# Patient Record
Sex: Female | Born: 1995 | Race: Black or African American | Hispanic: No | Marital: Single | Smoking: Current some day smoker
Health system: Southern US, Community
[De-identification: ages and names within clinical notes are randomized; demographics above are authoritative.]

## PROBLEM LIST (undated history)

## (undated) DIAGNOSIS — Z789 Other specified health status: Secondary | ICD-10-CM

## (undated) HISTORY — PX: NO PAST SURGERIES: SHX2092

---

## 2018-07-09 NOTE — L&D Delivery Note (Signed)
Delivery Note:  Arrived at [facility] ER 1 minute after VMI delivered by ER physician.  Placenta by gentle traction shortly afterwards, intact 3 vessel cord. NICU team provided care to infant. Small vaginal wall laceration, no bleeding and required no repair. No other lacerations or tears noted. EBL 500 cc. Pt given pitocin 10 units IM and IV pitocin 40 units, plus 1000 mcg of Cytotec rectal. Uterus firm and appropriate lochia noted.  Pt transported to [facility] via [facility] for routine postpartum care. Infant transported to NICU via care link for further evaluation and care.

## 2018-07-28 ENCOUNTER — Other Ambulatory Visit: Payer: Self-pay

## 2018-07-28 ENCOUNTER — Emergency Department
Admission: EM | Admit: 2018-07-28 | Discharge: 2018-07-28 | Disposition: A | Discharge status: Home / Self Care | Payer: Self-pay | Attending: Emergency Medicine | Admitting: Emergency Medicine

## 2018-07-28 ENCOUNTER — Emergency Department: Payer: Self-pay

## 2018-07-28 DIAGNOSIS — R102 Pelvic and perineal pain: Secondary | ICD-10-CM

## 2018-07-28 DIAGNOSIS — R1030 Lower abdominal pain, unspecified: Secondary | ICD-10-CM

## 2018-07-28 DIAGNOSIS — O9989 Other specified diseases and conditions complicating pregnancy, childbirth and the puerperium: Secondary | ICD-10-CM | POA: Insufficient documentation

## 2018-07-28 DIAGNOSIS — Z3A18 18 weeks gestation of pregnancy: Secondary | ICD-10-CM

## 2018-07-28 LAB — URINALYSIS, COMPLETE (UACMP) WITH MICROSCOPIC
Bacteria, UA: NONE SEEN
Bilirubin Urine: NEGATIVE
Glucose, UA: NEGATIVE mg/dL
Hgb urine dipstick: NEGATIVE
Ketones, ur: NEGATIVE mg/dL
Nitrite: NEGATIVE
Protein, ur: NEGATIVE mg/dL
Specific Gravity, Urine: 1.017 (ref 1.005–1.030)
pH: 6 (ref 5.0–8.0)

## 2018-07-28 LAB — COMPREHENSIVE METABOLIC PANEL
ALT: 9 U/L (ref 0–44)
AST: 14 U/L — ABNORMAL LOW (ref 15–41)
Albumin: 3.6 g/dL (ref 3.5–5.0)
Alkaline Phosphatase: 55 U/L (ref 38–126)
Anion gap: 6 (ref 5–15)
BUN: <5 mg/dL — ABNORMAL LOW (ref 6–20)
CO2: 21 mmol/L — ABNORMAL LOW (ref 22–32)
Calcium: 8.9 mg/dL (ref 8.9–10.3)
Chloride: 108 mmol/L (ref 98–111)
Creatinine, Ser: 0.52 mg/dL (ref 0.44–1.00)
GFR calc Af Amer: >60 mL/min (ref >60)
GFR calc non Af Amer: >60 mL/min (ref >60)
Glucose, Bld: 93 mg/dL (ref 70–99)
POTASSIUM: 3.4 mmol/L — AB (ref 3.5–5.1)
Sodium: 135 mmol/L (ref 135–145)
Total Bilirubin: 0.7 mg/dL (ref 0.3–1.2)
Total Protein: 7.2 g/dL (ref 6.5–8.1)

## 2018-07-28 LAB — HCG, QUANTITATIVE, PREGNANCY: hCG, Beta Chain, Quant, S: 8663 m[IU]/mL — ABNORMAL HIGH (ref <5)

## 2018-07-28 LAB — CBC
HCT: 34.5 % — ABNORMAL LOW (ref 36.0–46.0)
Hemoglobin: 12.6 g/dL (ref 12.0–15.0)
MCH: 29.2 pg (ref 26.0–34.0)
MCHC: 36.5 g/dL — ABNORMAL HIGH (ref 30.0–36.0)
MCV: 79.9 fL — ABNORMAL LOW (ref 80.0–100.0)
Platelets: 299 10*3/uL (ref 150–400)
RBC: 4.32 MIL/uL (ref 3.87–5.11)
RDW: 12.8 % (ref 11.5–15.5)
WBC: 6.8 10*3/uL (ref 4.0–10.5)
nRBC: 0 % (ref 0.0–0.2)

## 2018-07-28 LAB — LIPASE, BLOOD: LIPASE: 21 U/L (ref 11–51)

## 2018-07-28 MED ORDER — SODIUM CHLORIDE 0.9% FLUSH: 3.0000 mL | Freq: Once | INTRAVENOUS | Status: DC

## 2018-07-28 NOTE — ED Provider Notes (Signed)
[facility] Emergency Department Provider Note   ____________________________________________    I have reviewed the triage vital signs and the nursing notes.   HISTORY  Chief Complaint Abdominal Pain     HPI Mary Underwood is a 23 y.o. female who presents with complaints of mild abdominal cramping over the last several days.  She reports she believes she is about [redacted] weeks pregnant.  She has not seen OB for this pregnancy.  This is her fourth pregnancy.  She reports she is Rh-, no vaginal bleeding.  Has not taken anything for her cramping.  No radiation of pain.  No nausea or vomiting.  No vaginal discharge  History reviewed. No pertinent past medical history.  There are no active problems to display for this patient.   History reviewed. No pertinent surgical history.  Prior to Admission medications   Not on File     Allergies Patient has no known allergies.  No family history on file.  Social History Social History   Tobacco Use  . Smoking status: Never Smoker  . Smokeless tobacco: Never Used  Substance Use Topics  . Alcohol use: Not Currently  . Drug use: Not Currently    Review of Systems  Constitutional: No fever/chills Eyes: No visual changes.  ENT: No sore throat. Cardiovascular: Denies chest pain. Respiratory: Denies shortness of breath. Gastrointestinal: As above Genitourinary: As above Musculoskeletal: Negative for back pain. Skin: Negative for rash. Neurological: Negative for headaches   ____________________________________________   PHYSICAL EXAM:  VITAL SIGNS: ED Triage Vitals  Enc Vitals Group     BP 07/28/18 1337 100/83     Pulse Rate 07/28/18 1337 95     Resp 07/28/18 1337 16     Temp 07/28/18 1337 97.8 F (36.6 C)     Temp Source 07/28/18 1337 Oral     SpO2 07/28/18 1337 97 %     Weight --      Height 07/28/18 1338 1.702 m (5\' 7" )     Head Circumference --      Peak Flow --      Pain Score  07/28/18 1338 6     Pain Loc --      Pain Edu? --      Excl. in GC? --     Constitutional: Alert and oriented. No acute distress. Pleasant and interactive  Nose: No congestion/rhinnorhea. Mouth/Throat: Mucous membranes are moist.    Cardiovascular: Normal rate, regular rhythm. Grossly normal heart sounds.  Good peripheral circulation. Respiratory: Normal respiratory effort.  No retractions. Lungs CTAB. Gastrointestinal: Soft and nontender. No distention.  Gravid Genitourinary: deferred for specialist Musculoskeletal:   Warm and well perfused Neurologic:  Normal speech and language. No gross focal neurologic deficits are appreciated.  Skin:  Skin is warm, dry and intact. No rash noted. Psychiatric: Mood and affect are normal. Speech and behavior are normal.  ____________________________________________   LABS (all labs ordered are listed, but only abnormal results are displayed)  Labs Reviewed  COMPREHENSIVE METABOLIC PANEL - Abnormal; Notable for the following components:      Result Value   Potassium 3.4 (*)    CO2 21 (*)    BUN <5 (*)    AST 14 (*)    All other components within normal limits  CBC - Abnormal; Notable for the following components:   HCT 34.5 (*)    MCV 79.9 (*)    MCHC 36.5 (*)    All other components within normal limits  URINALYSIS, COMPLETE (UACMP) WITH MICROSCOPIC - Abnormal; Notable for the following components:   Color, Urine YELLOW (*)    APPearance CLEAR (*)    Leukocytes, UA TRACE (*)    All other components within normal limits  HCG, QUANTITATIVE, PREGNANCY - Abnormal; Notable for the following components:   hCG, Beta Chain, Quant, S 1,6108,663 (*)    All other components within normal limits  LIPASE, BLOOD   ____________________________________________  EKG   ____________________________________________  RADIOLOGY  Ultrasound demonstrates 18-week pregnancy ____________________________________________   PROCEDURES  Procedure(s)  performed: No  Procedures   Critical Care performed: No ____________________________________________   INITIAL IMPRESSION / ASSESSMENT AND PLAN / ED COURSE  Pertinent labs & imaging results that were available during my care of the patient were reviewed by me and considered in my medical decision making (see chart for details).  Patient with intermittent mild abdominal cramping particularly in the lower abdomen.  Ultrasound demonstrates 18-week pregnancy, she has not seen OB for this.  Pain relieved with Tylenol significantly.  Lab work is overall unremarkable.  She will require close follow-up with OB.    ____________________________________________   FINAL CLINICAL IMPRESSION(S) / ED DIAGNOSES  Final diagnoses:  [redacted] weeks gestation of pregnancy  Lower abdominal pain        Note:  This document was prepared using Dragon voice recognition software and may include unintentional dictation errors.   Jene EveryKinner, Sondi Desch, MD 07/28/18 [phone]

## 2018-07-28 NOTE — ED Triage Notes (Signed)
Pt c/o lower abd pain that radiates into the back for the past 2 days. Denies urinary sx or bleeding. Pt states she is Rh-.

## 2018-07-28 NOTE — ED Triage Notes (Signed)
First Nurse Note:  Arrives, [redacted] weeks pregnant, c/o lower abdominal pain x 2 days.  AAOx3.  Skin warm and dry. NAD

## 2018-11-17 ENCOUNTER — Other Ambulatory Visit: Payer: Self-pay

## 2018-11-17 ENCOUNTER — Inpatient Hospital Stay (HOSPITAL_COMMUNITY)
Admission: EM | Admit: 2018-11-17 | Discharge: 2018-11-18 | DRG: 807 | Disposition: A | Discharge status: Home / Self Care | Payer: Medicaid Other | Attending: Obstetrics and Gynecology | Admitting: Obstetrics and Gynecology

## 2018-11-17 DIAGNOSIS — Z3A32 32 weeks gestation of pregnancy: Secondary | ICD-10-CM

## 2018-11-17 DIAGNOSIS — O479 False labor, unspecified: Secondary | ICD-10-CM | POA: Diagnosis present

## 2018-11-17 DIAGNOSIS — Z6791 Unspecified blood type, Rh negative: Secondary | ICD-10-CM

## 2018-11-17 DIAGNOSIS — O26893 Other specified pregnancy related conditions, third trimester: Secondary | ICD-10-CM | POA: Diagnosis not present

## 2018-11-17 DIAGNOSIS — O09899 Supervision of other high risk pregnancies, unspecified trimester: Secondary | ICD-10-CM

## 2018-11-17 DIAGNOSIS — O4700 False labor before 37 completed weeks of gestation, unspecified trimester: Secondary | ICD-10-CM | POA: Diagnosis present

## 2018-11-17 DIAGNOSIS — Z3A34 34 weeks gestation of pregnancy: Secondary | ICD-10-CM

## 2018-11-17 DIAGNOSIS — O26899 Other specified pregnancy related conditions, unspecified trimester: Secondary | ICD-10-CM

## 2018-11-17 DIAGNOSIS — O0930 Supervision of pregnancy with insufficient antenatal care, unspecified trimester: Secondary | ICD-10-CM

## 2018-11-17 DIAGNOSIS — D582 Other hemoglobinopathies: Secondary | ICD-10-CM | POA: Diagnosis present

## 2018-11-17 DIAGNOSIS — Z1159 Encounter for screening for other viral diseases: Secondary | ICD-10-CM

## 2018-11-17 HISTORY — DX: Other specified health status: Z78.9

## 2018-11-17 LAB — COMPREHENSIVE METABOLIC PANEL
ALT: 10 U/L (ref 0–44)
AST: 13 U/L — ABNORMAL LOW (ref 15–41)
Albumin: 3 g/dL — ABNORMAL LOW (ref 3.5–5.0)
Alkaline Phosphatase: 177 U/L — ABNORMAL HIGH (ref 38–126)
Anion gap: 8 (ref 5–15)
BUN: 5 mg/dL — ABNORMAL LOW (ref 6–20)
CO2: 20 mmol/L — ABNORMAL LOW (ref 22–32)
Calcium: 8.7 mg/dL — ABNORMAL LOW (ref 8.9–10.3)
Chloride: 107 mmol/L (ref 98–111)
Creatinine, Ser: 0.52 mg/dL (ref 0.44–1.00)
GFR calc Af Amer: >60 mL/min (ref >60)
GFR calc non Af Amer: >60 mL/min (ref >60)
Glucose, Bld: 106 mg/dL — ABNORMAL HIGH (ref 70–99)
Potassium: 3.6 mmol/L (ref 3.5–5.1)
Sodium: 135 mmol/L (ref 135–145)
Total Bilirubin: 0.4 mg/dL (ref 0.3–1.2)
Total Protein: 6.7 g/dL (ref 6.5–8.1)

## 2018-11-17 LAB — PROTIME-INR
INR: 0.9 (ref 0.8–1.2)
Prothrombin Time: 12.3 seconds (ref 11.4–15.2)

## 2018-11-17 LAB — CBC
HCT: 31 % — ABNORMAL LOW (ref 36.0–46.0)
HCT: 28.5 % — ABNORMAL LOW (ref 36.0–46.0)
Hemoglobin: 10.9 g/dL — ABNORMAL LOW (ref 12.0–15.0)
Hemoglobin: 10.2 g/dL — ABNORMAL LOW (ref 12.0–15.0)
MCH: 29.6 pg (ref 26.0–34.0)
MCH: 29.3 pg (ref 26.0–34.0)
MCHC: 35.2 g/dL (ref 30.0–36.0)
MCHC: 35.8 g/dL (ref 30.0–36.0)
MCV: 84.2 fL (ref 80.0–100.0)
MCV: 81.9 fL (ref 80.0–100.0)
Platelets: 273 10*3/uL (ref 150–400)
Platelets: 269 10*3/uL (ref 150–400)
RBC: 3.68 MIL/uL — ABNORMAL LOW (ref 3.87–5.11)
RBC: 3.48 MIL/uL — ABNORMAL LOW (ref 3.87–5.11)
RDW: 13.3 % (ref 11.5–15.5)
RDW: 13.2 % (ref 11.5–15.5)
WBC: 8.7 10*3/uL (ref 4.0–10.5)
WBC: 16.1 10*3/uL — ABNORMAL HIGH (ref 4.0–10.5)
nRBC: 0 % (ref 0.0–0.2)
nRBC: 0 % (ref 0.0–0.2)

## 2018-11-17 LAB — TYPE AND SCREEN
ABO/RH(D): O NEG
Antibody Screen: NEGATIVE

## 2018-11-17 LAB — RAPID URINE DRUG SCREEN, HOSP PERFORMED
Amphetamines: NOT DETECTED
Barbiturates: NOT DETECTED
Benzodiazepines: NOT DETECTED
Cocaine: NOT DETECTED
Opiates: NOT DETECTED
Tetrahydrocannabinol: NOT DETECTED

## 2018-11-17 LAB — RPR: RPR Ser Ql: NONREACTIVE

## 2018-11-17 LAB — HIV ANTIBODY (ROUTINE TESTING W REFLEX): HIV Screen 4th Generation wRfx: NONREACTIVE

## 2018-11-17 LAB — APTT: aPTT: 27 seconds (ref 24–36)

## 2018-11-17 LAB — ABO/RH: ABO/RH(D): O NEG

## 2018-11-17 LAB — SARS CORONAVIRUS 2 BY RT PCR (HOSPITAL ORDER, PERFORMED IN ~~LOC~~ HOSPITAL LAB): SARS Coronavirus 2: NEGATIVE

## 2018-11-17 LAB — HEPATITIS B SURFACE ANTIGEN: Hepatitis B Surface Ag: NEGATIVE

## 2018-11-17 MED ORDER — WITCH HAZEL-GLYCERIN EX PADS: 1.0000 "application " | MEDICATED_PAD | CUTANEOUS | Status: DC | PRN

## 2018-11-17 MED ORDER — BETAMETHASONE SOD PHOS & ACET 6 (3-3) MG/ML IJ SUSP
12.0000 mg | Freq: Once | INTRAMUSCULAR | Status: AC
  Administered 2018-11-17: 12 mg via INTRAMUSCULAR
  Filled 2018-11-17: qty 5

## 2018-11-17 MED ORDER — OXYCODONE-ACETAMINOPHEN 5-325 MG PO TABS: 1.0000 | ORAL_TABLET | ORAL | Status: DC | PRN

## 2018-11-17 MED ORDER — SENNOSIDES-DOCUSATE SODIUM 8.6-50 MG PO TABS
2.0000 | ORAL_TABLET | ORAL | Status: DC
  Administered 2018-11-18: 2 via ORAL
  Filled 2018-11-17: qty 2

## 2018-11-17 MED ORDER — OXYTOCIN 40 UNITS IN NORMAL SALINE INFUSION - SIMPLE MED
2.5000 [IU]/h | INTRAVENOUS | Status: DC | PRN
  Administered 2018-11-17 (×2): 2.5 [IU]/h via INTRAVENOUS

## 2018-11-17 MED ORDER — SIMETHICONE 80 MG PO CHEW: 80.0000 mg | CHEWABLE_TABLET | ORAL | Status: DC | PRN

## 2018-11-17 MED ORDER — OXYTOCIN 10 UNIT/ML IJ SOLN
INTRAMUSCULAR | Status: AC
  Administered 2018-11-17: 05:00:00 10 [IU]
  Filled 2018-11-17: qty 1

## 2018-11-17 MED ORDER — TETANUS-DIPHTH-ACELL PERTUSSIS 5-2.5-18.5 LF-MCG/0.5 IM SUSP
0.5000 mL | Freq: Once | INTRAMUSCULAR | Status: AC
  Administered 2018-11-18: 11:00:00 0.5 mL via INTRAMUSCULAR
  Filled 2018-11-17: qty 0.5

## 2018-11-17 MED ORDER — BENZOCAINE-MENTHOL 20-0.5 % EX AERO: 1.0000 "application " | INHALATION_SPRAY | CUTANEOUS | Status: DC | PRN

## 2018-11-17 MED ORDER — DIBUCAINE (PERIANAL) 1 % EX OINT: 1.0000 "application " | TOPICAL_OINTMENT | CUTANEOUS | Status: DC | PRN

## 2018-11-17 MED ORDER — DIPHENHYDRAMINE HCL 25 MG PO CAPS: 25.0000 mg | ORAL_CAPSULE | Freq: Four times a day (QID) | ORAL | Status: DC | PRN

## 2018-11-17 MED ORDER — ACETAMINOPHEN 325 MG PO TABS: 650.0000 mg | ORAL_TABLET | ORAL | Status: DC | PRN

## 2018-11-17 MED ORDER — LIDOCAINE HCL 1 % IJ SOLN
INTRAMUSCULAR | Status: AC
  Filled 2018-11-17: qty 20

## 2018-11-17 MED ORDER — ONDANSETRON HCL 4 MG/2ML IJ SOLN: 4.0000 mg | INTRAMUSCULAR | Status: DC | PRN

## 2018-11-17 MED ORDER — ZOLPIDEM TARTRATE 5 MG PO TABS: 5.0000 mg | ORAL_TABLET | Freq: Every evening | ORAL | Status: DC | PRN

## 2018-11-17 MED ORDER — LACTATED RINGERS IV SOLN: INTRAVENOUS | Status: DC

## 2018-11-17 MED ORDER — MISOPROSTOL 200 MCG PO TABS
1000.0000 ug | ORAL_TABLET | Freq: Once | ORAL | Status: AC
  Administered 2018-11-17: 1000 ug via RECTAL

## 2018-11-17 MED ORDER — LACTATED RINGERS IV BOLUS
1000.0000 mL | Freq: Once | INTRAVENOUS | Status: AC
  Administered 2018-11-17: 1000 mL via INTRAVENOUS

## 2018-11-17 MED ORDER — ONDANSETRON HCL 4 MG PO TABS: 4.0000 mg | ORAL_TABLET | ORAL | Status: DC | PRN

## 2018-11-17 MED ORDER — OXYCODONE-ACETAMINOPHEN 5-325 MG PO TABS: 2.0000 | ORAL_TABLET | ORAL | Status: DC | PRN

## 2018-11-17 MED ORDER — RHO D IMMUNE GLOBULIN 1500 UNIT/2ML IJ SOSY
300.0000 ug | PREFILLED_SYRINGE | Freq: Once | INTRAMUSCULAR | Status: DC
  Filled 2018-11-17: qty 2

## 2018-11-17 MED ORDER — COCONUT OIL OIL: 1.0000 "application " | TOPICAL_OIL | Status: DC | PRN

## 2018-11-17 MED ORDER — IBUPROFEN 600 MG PO TABS
600.0000 mg | ORAL_TABLET | Freq: Four times a day (QID) | ORAL | Status: DC
  Administered 2018-11-17 – 2018-11-18 (×4): 600 mg via ORAL
  Filled 2018-11-17 (×5): qty 1

## 2018-11-17 MED ORDER — PRENATAL MULTIVITAMIN CH
1.0000 | ORAL_TABLET | Freq: Every day | ORAL | Status: DC
  Administered 2018-11-17: 12:00:00 1 via ORAL
  Filled 2018-11-17 (×2): qty 1

## 2018-11-17 NOTE — Progress Notes (Signed)
Pt. Arrived to [facility] c/o ctx since 1am. [facility] RN notified [facility]OB of patients arrival @0343 . [facility] RN arrived to [facility] ED @ [phone]. Pt. Noted to be contracting every 2-4 minutes and 7cm dilated. Pt. Given 12mg  betamethasone IM per MD order. Attending MD notified and transport en route. Pt. Progressed rapidly before transport and SVD @0435  by Dr. Rhunette Croft (ED Physician). Attending MD Dr. Alysia Penna arrived @0436  and delivered placenta @0441 . [facility] provided blow by oxygen to baby until NICU arrived.   Pt. Had moderate bleeding with some clots. 10 oxytocin given IM followed by 1000mg  cytotec and 40 oxytocin IV. Bleeding controlled. No vaginal repair. Pt. Prepped for transport via [facility] to [facility] specialty care unit. Report given to Marquis Buggy RN. Pt. Vitals stable upon transport.   FHR tracing on paper. Sent to be copied into patient chart.

## 2018-11-17 NOTE — ED Notes (Signed)
Rapid OB RN called 

## 2018-11-17 NOTE — ED Provider Notes (Addendum)
[facility]-EMERGENCY DEPT Provider Note   CSN: [id number] Arrival date & time: 11/17/18  0341    History   Chief Complaint Chief Complaint  Patient presents with  . Contractions    HPI Micki RileyChristina Kloeppel is a 23 y.o. female.     HPI 23 year old 714, P3 woman who is about [redacted] weeks pregnant comes into the ER with chief complaint of abdominal pain.  Patient does not have an active OB doctor and is new to [place].  She reports that all day she has been having some abdominal and back discomfort.  However around 1:00 in the morning she started having regular contraction type pain, that have gotten intense and more frequent.  She describes the pain is similar to her labor pain.  All of patient's prior pregnancy have led to vaginal delivery and there have been no complications.  She denies any complications with this pregnancy.  Patient has no medical problems.  She denies any trauma, UTI-like symptoms, vaginal discharge, vaginal bleeding and has not noted a gush of fluid down her legs.  Past Medical History:  Diagnosis Date  . Medical history non-contributory     Patient Active Problem List   Diagnosis Date Noted  . Postpartum care following vaginal delivery 11/17/2018    Past Surgical History:  Procedure Laterality Date  . NO PAST SURGERIES       OB History    Gravida  5   Para  3   Term  3   Preterm      AB  1   Living  3     SAB      TAB  1   Ectopic      Multiple      Live Births  3            Home Medications    Prior to Admission medications   Not on File    Family History History reviewed. No pertinent family history.  Social History Social History   Tobacco Use  . Smoking status: Never Smoker  . Smokeless tobacco: Never Used  Substance Use Topics  . Alcohol use: Not Currently  . Drug use: Not Currently     Allergies   Patient has no known allergies.   Review of Systems Review of Systems  Unable to  perform ROS: Acuity of condition  Constitutional: Positive for activity change.  Gastrointestinal: Positive for abdominal pain. Negative for nausea.  Genitourinary: Negative for dysuria.  Allergic/Immunologic: Negative for immunocompromised state.  Hematological: Does not bruise/bleed easily.     Physical Exam Updated Vital Signs BP 121/74 (BP Location: Left Arm)   Pulse 85   Temp 98.1 F (36.7 C) (Oral)   Resp 18   Ht 5\' 6"  (1.676 m)   Wt 72.6 kg   SpO2 99%   BMI 25.82 kg/m   Physical Exam Vitals signs and nursing note reviewed.  Constitutional:      Appearance: She is well-developed.  HENT:     Head: Normocephalic and atraumatic.  Neck:     Musculoskeletal: Normal range of motion and neck supple.  Cardiovascular:     Rate and Rhythm: Normal rate.  Pulmonary:     Effort: Pulmonary effort is normal.  Abdominal:     General: Bowel sounds are normal.  Genitourinary:    Comments: Perform a rapid response nurse. Skin:    General: Skin is warm and dry.  Neurological:     Mental Status: She is alert  and oriented to person, place, and time.      ED Treatments / Results  Labs (all labs ordered are listed, but only abnormal results are displayed) Labs Reviewed  CBC - Abnormal; Notable for the following components:      Result Value   RBC 3.68 (*)    Hemoglobin 10.9 (*)    HCT 31.0 (*)    All other components within normal limits  COMPREHENSIVE METABOLIC PANEL - Abnormal; Notable for the following components:   CO2 20 (*)    Glucose, Bld 106 (*)    BUN 5 (*)    Calcium 8.7 (*)    Albumin 3.0 (*)    AST 13 (*)    Alkaline Phosphatase 177 (*)    All other components within normal limits  SARS CORONAVIRUS 2 (HOSPITAL ORDER, PERFORMED IN [facility])  PROTIME-INR  APTT  CBC  HEPATITIS B SURFACE ANTIGEN  RUBELLA SCREEN  RPR  HIV ANTIBODY (ROUTINE TESTING W REFLEX)    EKG None  Radiology No results found.  Procedures  Normal spontaneous  vaginal delivery  Date of procedure: 11-17-18 Preop diagnosis: 34-week IUP in active labor Postop diagnosis: Delivery of a 34-week gestation female around 430 a.m. Procedure: Normal spontaneous vaginal delivery Anesthesia: None Physician: Derwood Kaplan, MD emergency medicine EBL: 300 ml  Indication: 23 year old G7, P3 woman presents to the ER at about 34 weeks IUP with regular uterine contractions.  Her prenatal course is uncomplicated, except for the fact that she has not had appropriate follow-up and there has been labs and prenatal care.  Patient is unsure if she had any routine screening done.  Her prior deliveries were all vaginal and were without any complications.  Patient presented to the ER around 3:45 AM.  Subsequent exam revealed that patient was 7-1/2 cm dilated without rupture of membrane.  Within few minutes patient advanced to 9 cm dilation.  Procedure note: The patient was noted to be advancing quickly.  She was having regular and frequent contractions which were severe.  Patient was placed in dorsal lithotomy position and prepped for the vaginal delivery.  No anesthesia was provided and the patient tolerated the procedure without significant difficulty.  The patient was asked to push in synchrony with the contraction, and the entire baby was delivered spontaneously during 1 push.  The infant had a spontaneous cry and was passed on to the warmer there the oropharynx and the nasopharynx were then suctioned.  Nuchal cord was checked, clamped and cut.  2 arteries and 1 vein were noted.  No laceration was appreciated.  Fundal massage initiated.  Placenta was delivered subsequently without any difficulty.  Fetal heart tones were reassuring. Appropriate labs were ordered.  Patient remained normotensive throughout the procedure.  After the delivery of the infant, the placenta was also delivered without significant problems.  Fundal massage was continued until OB team arrived.  We were  able to remove some of the blood clots from the vaginal vault, and OB team took over given persistent bleeding.  .Critical Care Performed by: Derwood Kaplan, MD Authorized by: Derwood Kaplan, MD   Critical care provider statement:    Critical care time (minutes):  31   Critical care was necessary to treat or prevent imminent or life-threatening deterioration of the following conditions: LABOR.   Critical care was time spent personally by me on the following activities:  Discussions with consultants, evaluation of patient's response to treatment, examination of patient, ordering and performing treatments and  interventions, ordering and review of laboratory studies, ordering and review of radiographic studies, pulse oximetry, re-evaluation of patient's condition, obtaining history from patient or surrogate and review of old charts   I assumed direction of critical care for this patient from another provider in my specialty: yes     (including critical care time)  Medications Ordered in ED Medications  rho (d) immune globulin (RHIG/RHOPHYLAC) injection 300 mcg (has no administration in time range)  ibuprofen (ADVIL) tablet 600 mg (has no administration in time range)  acetaminophen (TYLENOL) tablet 650 mg (has no administration in time range)  zolpidem (AMBIEN) tablet 5 mg (has no administration in time range)  diphenhydrAMINE (BENADRYL) capsule 25 mg (has no administration in time range)  senna-docusate (Senokot-S) tablet 2 tablet (has no administration in time range)  simethicone (MYLICON) chewable tablet 80 mg (has no administration in time range)  ondansetron (ZOFRAN) tablet 4 mg (has no administration in time range)    Or  ondansetron (ZOFRAN) injection 4 mg (has no administration in time range)  prenatal multivitamin tablet 1 tablet (has no administration in time range)  witch hazel-glycerin (TUCKS) pad 1 application (has no administration in time range)    And  dibucaine  (NUPERCAINAL) 1 % rectal ointment 1 application (has no administration in time range)  benzocaine-Menthol (DERMOPLAST) 20-0.5 % topical spray 1 application (has no administration in time range)  coconut oil (has no administration in time range)  Tdap (BOOSTRIX) injection 0.5 mL (has no administration in time range)  oxytocin (PITOCIN) IV infusion 40 units in NS 1000 mL - Premix (2.5 Units/hr Intravenous New Bag/Given 11/17/18 0500)  lactated ringers infusion (has no administration in time range)  oxyCODONE-acetaminophen (PERCOCET/ROXICET) 5-325 MG per tablet 1 tablet (has no administration in time range)  oxyCODONE-acetaminophen (PERCOCET/ROXICET) 5-325 MG per tablet 2 tablet (has no administration in time range)  lactated ringers bolus 1,000 mL (1,000 mLs Intravenous New Bag/Given 11/17/18 0416)  betamethasone acetate-betamethasone sodium phosphate (CELESTONE) injection 12 mg (12 mg Intramuscular Given 11/17/18 0417)  oxytocin (PITOCIN) 10 UNIT/ML injection (10 Units  Given 11/17/18 0447)  misoprostol (CYTOTEC) tablet 1,000 mcg (1,000 mcg Rectal Given 11/17/18 0500)     Initial Impression / Assessment and Plan / ED Course  I have reviewed the triage vital signs and the nursing notes.  Pertinent labs & imaging results that were available during my care of the patient were reviewed by me and considered in my medical decision making (see chart for details).        23 year old G45, P3 woman comes to the ER with chief complaint of abdominal and pelvic pain. It appears that she is in labor based on history. Patient was placed on toco monitor, and it confirms that she is having contractions every 3 to 4 minutes, and that patient is in early labor. OB history is unremarkable besides 3 into NSVD, last delivery being 1 year ago.  She has had labs to follow-up with this pregnancy, but it does not appear that she has had any complications.  Medically she is healthy.  7:02 AM Rapid response OB nurse had  completed the pelvic exam.  Patient is noted to be at 7-1/2 centimeter dilation. Case discussed with the [facility] physician by myself and the rapid response nurse.  We have already contacted [facility] to arrive for rapid transfer to [facility].  Basic labs, fluid bolus given.  Betamethasone given at the request of OB team.  Final Clinical Impressions(s) / ED Diagnoses   Final  diagnoses:  Preterm labor in third trimester with preterm delivery, single or unspecified fetus    ED Discharge Orders    None       Derwood Kaplan, MD 11/17/18 Jeannie Fend, MD 11/17/18 [phone]

## 2018-11-17 NOTE — ED Triage Notes (Addendum)
Pt arrived having contractions roughly 10 minutes apart around 1am. Pt stating she woke up from her sleep with increased contractions, currently having contractions roughly 4 minutes apart. Pt [redacted] weeks pregnant. Pt denies any fluids leaking.

## 2018-11-17 NOTE — ED Notes (Signed)
[facility] contacted for rapid transport. Spoke with Britta Mccreedy

## 2018-11-17 NOTE — H&P (Signed)
Mary Underwood is a 23 y.o. female [id number] s/p SVD at 34 weeks at [facility] ER. Pt received limited PNC. Last OB visit was in April.  Presented to [facility] ER with c/o painful ut ctx since 1 AM. Evaluated by rapid response OB nurse, noted to be contracting q 2-4 minutes, 7 cm dilated. Plan was to transfer to [facility] for further management and delivery.  However pt progressed rapidly and was unable to be transferred. I called back up attending, Dr. Marcie Bal into [facility] and I proceeded to [facility] ER.  Pt delivered VMI at 4:35 AM. I arrived 1 minute after delivery. See delivery note for additional information.  NICU arrived and provided care to infant OB History    Gravida  1   Para      Term      Preterm      AB      Living        SAB      TAB      Ectopic      Multiple      Live Births             No past medical history on file. No past surgical history on file. Family History: family history is not on file. Social History:  reports that she has never smoked. She has never used smokeless tobacco. She reports previous alcohol use. She reports previous drug use.      Review of Systems  Constitutional: Negative.   Respiratory: Negative.   Cardiovascular: Negative.   Gastrointestinal: Positive for abdominal pain.  Genitourinary: Negative.    History Dilation: 10 Effacement (%): 100 Station: Plus 2 Exam by:: C Lendell Caprice RN Blood pressure 111/73, pulse 86, temperature (!) 97.5 F (36.4 C), temperature source Oral, resp. rate (!) 22, height 5\' 6"  (1.676 m), weight 72.6 kg, SpO2 100 %. Exam Physical Exam  Constitutional: She appears well-developed and well-nourished.  Cardiovascular: Normal rate and regular rhythm.  Respiratory: Effort normal and breath sounds normal.  GI: Soft. Bowel sounds are normal.  Uterus firm, U-2  Genitourinary:    Genitourinary Comments: Nl EGBUS Small vaginal laceration, not bleeding Nl lochia     Prenatal labs: ABO, Rh:   Antibody:   Rubella:    RPR:    HBsAg:    HIV:    GBS:     Assessment/Plan: DOD SVD at 34 weeks Limited PNC.  Admit to OB SCU for routine postpartum care.   Hermina Staggers 11/17/2018, 6:07 AM

## 2018-11-17 NOTE — Progress Notes (Addendum)
Daily Postpartum Note  Admission Date: 11/17/2018 Current Date: 11/17/2018 9:45 AM  Mary Underwood is a 23 y.o. [id number] PPD#0 SVD/intact perineum @ 32wks (18wk u/s at [facility] ED). Patient admitted after PTB at the [facility] ED.  Pregnancy complicated by: Patient Active Problem List   Diagnosis Date Noted  . Postpartum care following vaginal delivery 11/17/2018  . No prenatal care in current pregnancy 11/17/2018  . Preterm delivery, delivered 11/17/2018  . Short interval between pregnancies affecting pregnancy, antepartum 11/17/2018  . 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 Pt about to walk to NICU to see baby  Objective:    Current Vital Signs 24h Vital Sign Ranges  T 98.7 F (37.1 C) Temp  Avg: 98.1 F (36.7 C)  Min: 97.5 F (36.4 C)  Max: 98.7 F (37.1 C)  BP (!) 108/55 BP  Min: 102/70  Max: 121/74  HR 78 Pulse  Avg: 88.1  Min: 78  Max: 98  RR 17 Resp  Avg: 17  Min: 15  Max: 22  SaO2 97 % (room air) SpO2  Avg: 99.3 %  Min: 97 %  Max: 100 %       24 Hour I/O Current Shift I/O  Time Ins Outs 05/10 0701 - 05/11 0700 In: -  Out: 600  No intake/output data recorded.    Physical exam: General: Well nourished, well developed female in no acute distress. Abdomen: nttp, firm fundus below umbilicus Skin: Warm and dry.   Medications: Current Facility-Administered Medications  Medication Dose Route Frequency Provider Last Rate Last Dose  . acetaminophen (TYLENOL) tablet 650 mg  650 mg Oral Q4H PRN Hermina Staggers, MD      . benzocaine-Menthol (DERMOPLAST) 20-0.5 % topical spray 1 application  1 application Topical PRN Hermina Staggers, MD      . coconut oil  1 application Topical PRN Hermina Staggers, MD      . witch hazel-glycerin (TUCKS) pad 1 application  1 application Topical PRN Hermina Staggers, MD       And  . dibucaine (NUPERCAINAL) 1 % rectal ointment 1 application  1 application Rectal PRN Hermina Staggers,  MD      . diphenhydrAMINE (BENADRYL) capsule 25 mg  25 mg Oral Q6H PRN Hermina Staggers, MD      . ibuprofen (ADVIL) tablet 600 mg  600 mg Oral Q6H Hermina Staggers, MD      . lactated ringers infusion   Intravenous Continuous Hermina Staggers, MD      . ondansetron [facility]) tablet 4 mg  4 mg Oral Q4H PRN Hermina Staggers, MD       Or  . ondansetron (ZOFRAN) injection 4 mg  4 mg Intravenous Q4H PRN Hermina Staggers, MD      . oxyCODONE-acetaminophen (PERCOCET/ROXICET) 5-325 MG per tablet 1 tablet  1 tablet Oral Q4H PRN Hermina Staggers, MD      . oxyCODONE-acetaminophen (PERCOCET/ROXICET) 5-325 MG per tablet 2 tablet  2 tablet Oral Q4H PRN Hermina Staggers, MD      . oxytocin (PITOCIN) IV infusion 40 units in NS 1000 mL - Premix  2.5 Units/hr Intravenous Continuous PRN Hermina Staggers, MD 62.5 mL/hr at 11/17/18 0500 2.5 Units/hr at 11/17/18 0500  . prenatal multivitamin tablet 1 tablet  1 tablet Oral Q1200 Hermina Staggers, MD      . rho (d) immune globulin (RHIG/RHOPHYLAC)  injection 300 mcg  300 mcg Intravenous Once Hermina StaggersErvin, Michael L, MD      . Melene Muller[START ON 11/18/2018] senna-docusate (Senokot-S) tablet 2 tablet  2 tablet Oral Q24H Hermina StaggersErvin, Michael L, MD      . simethicone [facility](MYLICON) chewable tablet 80 mg  80 mg Oral PRN Hermina StaggersErvin, Michael L, MD      . Melene Muller[START ON 11/18/2018] Tdap (BOOSTRIX) injection 0.5 mL  0.5 mL Intramuscular Once Hermina StaggersErvin, Michael L, MD      . zolpidem (AMBIEN) tablet 5 mg  5 mg Oral QHS PRN Hermina StaggersErvin, Michael L, MD        Labs:  Recent Labs  Lab 11/17/18 0409  WBC 8.7  HGB 10.9*  HCT 31.0*  PLT 273    Recent Labs  Lab 11/17/18 0409  NA 135  K 3.6  CL 107  CO2 20*  BUN 5*  CREATININE 0.52  CALCIUM 8.7*  PROT 6.7  BILITOT 0.4  ALKPHOS 177*  ALT 10  AST 13*  GLUCOSE 106*    Pending: hepB surf Ag, rpr, hiv, type and screen Negative: cmp, covid 19, coags  Radiology: none  Assessment & Plan:  Pt doing well *PP: routine care. [facility] F/u OB labs *no  PNC: SW consulted. UDS ordered. Pt states [facility] and never saw a clinic or provider for this pregnancy. Last pregnancy in 2019 records reviewed in care everywhere. *Rh neg: f/u t&s. Newborn is rh neg  *PPx: OOB ad lib *FEN/GI: regular diet *Dispo: likely PPD#2.   Cornelia Copaharlie Jameer Storie, Jr. MD Attending Center for [facility](Faculty Practice)

## 2018-11-17 NOTE — ED Notes (Signed)
Pharmacy contacted for med

## 2018-11-17 NOTE — Plan of Care (Signed)
  Problem: Education: Goal: Knowledge of General Education information will improve Description Including pain rating scale, medication(s)/side effects and non-pharmacologic comfort measures Outcome: Completed/Met

## 2018-11-17 NOTE — Lactation Note (Signed)
This note was copied from a baby's chart. Lactation Consultation Note  Patient Name: Mary Underwood KPTWS'F Date: 11/17/2018 Reason for consult: Initial assessment;Preterm <34wks Baby is 5 hours old/32.6 weeks in NICU.  Mom is currently sleeping but initiated pumping recently and obtained about 10 mls.  RN assisted mom.  Will follow up later.  Breastfeeding consultation services and NICU booklet left at bedside.  Maternal Data    Feeding    LATCH Score                   Interventions    Lactation Tools Discussed/Used Initiated by:: RN Date initiated:: 11/17/18   Consult Status Consult Status: Follow-up Date: 11/18/18 Follow-up type: In-patient    [name] 11/17/2018, 9:51 AM

## 2018-11-18 DIAGNOSIS — O4700 False labor before 37 completed weeks of gestation, unspecified trimester: Secondary | ICD-10-CM | POA: Diagnosis present

## 2018-11-18 DIAGNOSIS — O479 False labor, unspecified: Secondary | ICD-10-CM | POA: Diagnosis present

## 2018-11-18 LAB — RUBELLA SCREEN: Rubella: 1.22 index (ref >0.99)

## 2018-11-18 MED ORDER — IBUPROFEN 600 MG PO TABS: 600.0000 mg | ORAL_TABLET | Freq: Four times a day (QID) | ORAL | 0 refills | Status: DC

## 2018-11-18 MED ORDER — MEDROXYPROGESTERONE ACETATE 150 MG/ML IM SUSP
150.0000 mg | Freq: Once | INTRAMUSCULAR | Status: AC
  Administered 2018-11-18: 11:00:00 150 mg via INTRAMUSCULAR
  Filled 2018-11-18: qty 1

## 2018-11-18 NOTE — Progress Notes (Signed)
Teaching complete  Prescription  Given to pt  Ambulated out with family

## 2018-11-18 NOTE — Discharge Instructions (Signed)
Use the following websites (and others) to help learn more about your contraception options and find the method that is right for you!  - The Centers for Disease Control (CDC) website: TransferLive.sehttps://www.cdc.gov/reproductivehealth/contraception/index.htm  - [facility] Parenthood website: https://www.plannedparenthood.org/learn/birth-control  - Bedsider.org: https://www.bedsider.org/methods  Etonogestrel implant What is this medicine? ETONOGESTREL (et oh noe JES trel) is a contraceptive (birth control) device. It is used to prevent pregnancy. It can be used for up to 3 years. This medicine may be used for other purposes; ask your health care provider or pharmacist if you have questions. COMMON BRAND NAME(S): Implanon, Nexplanon What should I tell my health care provider before I take this medicine? They need to know if you have any of these conditions: -abnormal vaginal bleeding -blood vessel disease or blood clots -breast, cervical, endometrial, ovarian, liver, or uterine cancer -diabetes -gallbladder disease -heart disease or recent heart attack -high blood pressure -high cholesterol or triglycerides -kidney disease -liver disease -migraine headaches -seizures -stroke -tobacco smoker -an unusual or allergic reaction to etonogestrel, anesthetics or antiseptics, other medicines, foods, dyes, or preservatives -pregnant or trying to get pregnant -breast-feeding How should I use this medicine? This device is inserted just under the skin on the inner side of your upper arm by a health care professional. Talk to your pediatrician regarding the use of this medicine in children. Special care may be needed. Overdosage: If you think you have taken too much of this medicine contact a poison control center or emergency room at once. NOTE: This medicine is only for you. Do not share this medicine with others. What if I miss a dose? This does not apply. What may interact with this medicine? Do not  take this medicine with any of the following medications: -amprenavir -fosamprenavir This medicine may also interact with the following medications: -acitretin -aprepitant -armodafinil -bexarotene -bosentan -carbamazepine -certain medicines for fungal infections like fluconazole, ketoconazole, itraconazole and voriconazole -certain medicines to treat hepatitis, HIV or AIDS -cyclosporine -felbamate -griseofulvin -lamotrigine -modafinil -oxcarbazepine -phenobarbital -phenytoin -primidone -rifabutin -rifampin -rifapentine -St. John's wort -topiramate This list may not describe all possible interactions. Give your health care provider a list of all the medicines, herbs, non-prescription drugs, or dietary supplements you use. Also tell them if you smoke, drink alcohol, or use illegal drugs. Some items may interact with your medicine. What should I watch for while using this medicine? This product does not protect you against HIV infection (AIDS) or other sexually transmitted diseases. You should be able to feel the implant by pressing your fingertips over the skin where it was inserted. Contact your doctor if you cannot feel the implant, and use a non-hormonal birth control method (such as condoms) until your doctor confirms that the implant is in place. Contact your doctor if you think that the implant may have broken or become bent while in your arm. You will receive a user card from your health care provider after the implant is inserted. The card is a record of the location of the implant in your upper arm and when it should be removed. Keep this card with your health records. What side effects may I notice from receiving this medicine? Side effects that you should report to your doctor or health care professional as soon as possible: -allergic reactions like skin rash, itching or hives, swelling of the face, lips, or tongue -breast lumps, breast tissue changes, or  discharge -breathing problems -changes in emotions or moods -if you feel that the implant may have broken or bent  while in your arm -high blood pressure -pain, irritation, swelling, or bruising at the insertion site -scar at site of insertion -signs of infection at the insertion site such as fever, and skin redness, pain or discharge -signs and symptoms of a blood clot such as breathing problems; changes in vision; chest pain; severe, sudden headache; pain, swelling, warmth in the leg; trouble speaking; sudden numbness or weakness of the face, arm or leg -signs and symptoms of liver injury like dark yellow or brown urine; general ill feeling or flu-like symptoms; light-colored stools; loss of appetite; nausea; right upper belly pain; unusually weak or tired; yellowing of the eyes or skin -unusual vaginal bleeding, discharge Side effects that usually do not require medical attention (report to your doctor or health care professional if they continue or are bothersome): -acne -breast pain or tenderness -headache -irregular menstrual bleeding -nausea This list may not describe all possible side effects. Call your doctor for medical advice about side effects. You may report side effects to FDA at 1-800-FDA-1088. Where should I keep my medicine? This drug is given in a hospital or clinic and will not be stored at home. NOTE: This sheet is a summary. It may not cover all possible information. If you have questions about this medicine, talk to your doctor, pharmacist, or health care provider.  2019 Elsevier/Gold Standard (2017-05-14 14:11:42)  Intrauterine Device Information An intrauterine device (IUD) is a medical device that is inserted in the uterus to prevent pregnancy. It is a small, T-shaped device that has one or two nylon strings hanging down from it. The strings hang out of the lower part of the uterus (cervix) to allow for future IUD removal. There are two types of IUDs  available:  Hormone IUD. This type of IUD is made of plastic and contains the hormone progestin (synthetic progesterone). A hormone IUD may last 3-5 years.  Copper IUD. This type of IUD has copper wire wrapped around it. A copper IUD may last up to 10 years. How is an IUD inserted? An IUD is inserted through the vagina and placed into the uterus with a minor medical procedure. The exact procedure for IUD insertion may vary among health care providers and hospitals. How does an IUD work? Synthetic progesterone in a hormonal IUD prevents pregnancy by:  Thickening cervical mucus to prevent sperm from entering the uterus.  Thinning the uterine lining to prevent a fertilized egg from being implanted there. Copper in a copper IUD prevents pregnancy by making the uterus and fallopian tubes produce a fluid that kills sperm. What are the advantages of an IUD? Advantages of either type of IUD  It is highly effective in preventing pregnancy.  It is reversible. You can become pregnant shortly after the IUD is removed.  It is low-maintenance and can stay in place for a long time.  There are no estrogen-related side effects.  It can be used when breastfeeding.  It is not associated with weight gain.  It can be inserted right after childbirth, an abortion, or a miscarriage. Advantages of a hormone IUD  If it is inserted within 7 days of your period starting, it works right after it is inserted. If the hormone IUD is inserted at any other time in your cycle, you will need to use a backup method of birth control for 7 days after insertion.  It can make menstrual periods lighter.  It can reduce menstrual cramping.  It can be used for 3-5 years. Advantages of a copper  IUD  It works right after it is inserted.  It can be used as a form of emergency birth control if it is inserted within 5 days after having unprotected sex.  It does not interfere with your body's natural hormones.  It can  be used for 10 years. What are the disadvantages of an IUD?  An IUD may cause irregular menstrual bleeding for a period of time after insertion.  You may have pain during insertion and have cramping and vaginal bleeding after insertion.  An IUD may cut the uterus (uterine perforation) when it is inserted. This is rare.  An IUD may cause pelvic inflammatory disease (PID), which is an infection in the uterus and fallopian tubes. This is rare, and it usually happens during the first 20 days after the IUD is inserted.  A copper IUD can make your menstrual flow heavier and more painful. How is an IUD removed?  You will lie on your back with your knees bent and your feet in footrests (stirrups).  A device will be inserted into your vagina to spread apart the vaginal walls (speculum). This will allow your health care provider to see the strings attached to the IUD.  Your health care provider will use a small instrument (forceps) to grasp the IUD strings and pull firmly until the IUD is removed. You may have some discomfort when the IUD is removed. Your health care provider may recommend taking over-the-counter pain relievers, such as ibuprofen, before the procedure. You may also have minor spotting for a few days after the procedure. The exact procedure for IUD removal may vary among health care providers and hospitals. Is the IUD right for me? Your health care provider will make sure you are a good candidate for an IUD and will discuss the advantages, disadvantages, and possible side effects with you. Summary  An intrauterine device (IUD) is a medical device that is inserted in the uterus to prevent pregnancy. It is a small, T-shaped device that has one or two nylon strings hanging down from it.  A hormone IUD contains the hormone progestin (synthetic progesterone). A copper IUD has copper wire wrapped around it.  Synthetic progesterone in a hormone IUD prevents pregnancy by thickening  cervical mucus and thinning the walls of the uterus. Copper in a copper IUD prevents pregnancy by making the uterus and fallopian tubes produce a fluid that kills sperm.  A hormone IUD can be left in place for 3-5 years. A copper IUD can be left in place for up to 10 years.  An IUD is inserted and removed by a health care provider. You may feel some pain during insertion and removal. Your health care provider may recommend taking over-the-counter pain medicine, such as ibuprofen, before an IUD procedure. This information is not intended to replace advice given to you by your health care provider. Make sure you discuss any questions you have with your health care provider. Document Released: 05/29/2004 Document Revised: 07/24/2016 Document Reviewed: 07/24/2016 Elsevier Interactive Patient Education  2019 [employer].

## 2018-11-18 NOTE — Lactation Note (Signed)
This note was copied from a baby's chart. Lactation Consultation Note  Patient Name: Mary Underwood OPFYT'W Date: 11/18/2018 Reason for consult: Follow-up assessment;NICU baby   Baby in NICU 20 hours old. Mother pumping with DEBP on one side upon entering. Mother expressing more than 45 ml.   Suggest she double pump for more efficiency. Mother has not had her cell phone with the number that [facility] for her. Pump referral was sent yesterday. Suggest she call WIC to coordinate pump loaner and give reachable phone number. Discussed milk storage, transportation and engorgement care and pumping in NICU.    Maternal Data    Feeding    LATCH Score                   Interventions Interventions: DEBP  Lactation Tools Discussed/Used     Consult Status Consult Status: Complete Date: 11/18/18    Dahlia Byes [facility] 11/18/2018, 10:25 AM

## 2018-11-18 NOTE — Clinical Social Work Maternal (Signed)
CLINICAL SOCIAL WORK MATERNAL/CHILD NOTE  Patient Details  Name: Mary Underwood MRN: [id number] Date of Birth: 09/13/1995  Date:  11/18/2018  Clinical Social Worker Initiating Note:  Federica Allport, LCSW Date/Time: Initiated:  11/18/18/1108     Child's Name:  Mary Underwood   Biological Parents:  Mother, Father(Father: Anthony Underwood)   Need for Interpreter:  None   Reason for Referral:  Late or No Prenatal Care    Address:  [address] [place] [address]    Phone number:  [phone] (home)     Additional phone number:   Household Members/Support Persons (HM/SP):   Household Member/Support Person 1, Household Member/Support Person 2, Household Member/Support Person 3, Household Member/Support Person 4   HM/SP Name Relationship DOB or Age  HM/SP -1 Anthony Underwood  FOB 04-29-95  HM/SP -2 Khloe Underwood daughter 10-14-13  HM/SP -3 Khalia Underwood daughter 02-26-16  HM/SP -4 Khamani Underwood daughter 11-03-17  HM/SP -5        HM/SP -6        HM/SP -7        HM/SP -8          Natural Supports (not living in the home):  Parent, Immediate Family(Mom; Sisters)   Professional Supports: None   Employment: Unemployed   Type of Work:     Education:  Other (comment)(8th Grade)   Homebound arranged:    Financial Resources:      Other Resources:  WIC, Food Stamps    Cultural/Religious Considerations Which May Impact Care:    Strengths:  Ability to meet basic needs , Home prepared for child , Understanding of illness   Psychotropic Medications:         Pediatrician:       Pediatrician List:   [place]    [place]    [place]    [place]    [place]    [place]      Pediatrician Fax Number:    Risk Factors/Current Problems:  Other (Comment)(DHHS CPS history ([place]   Cognitive State:  Able to Concentrate , Alert , Linear Thinking , Goal Oriented , Insightful    Mood/Affect:  Interested , Relaxed ,  Calm    CSW Assessment: CSW met with MOB at bedside, FOB was present and asleep on the couch. MOB granted CSW verbal permission to speak in front of FOB about anything. CSW introduced self and explained reason for consult. MOB was welcoming and engaged during assessment. MOB reported that she resides with FOB and her 3 daughters. MOB reported that she is currently unemployed and receives both WIC and Food Stamps. MOB reported that she has clothes for the infant and has to work on getting diapers and wipes. MOB reported that she feels she will be able to get all items infant needs prior to discharge. MOB reported that she also has infant's car seat and basinet. CSW inquired about MOB's support system, MOB reported that her mom and sisters were her supports.   CSW inquired about MOB's mental health history, MOB denied any mental health history. MOB denied any history of postpartum depression with three older children. MOB presented calm and did not demonstrate any acute mental health signs/symptoms. CSW assessed for safety, MOB denied SI and HI.  CSW provided education regarding the baby blues period vs. perinatal mood disorders, discussed treatment and gave resources for mental health follow up if concerns arise.  CSW recommends self-evaluation during the postpartum time period   using the New Mom Checklist from Postpartum Progress and encouraged MOB to contact a medical professional if symptoms are noted at any time.    CSW provided review of Sudden Infant Death Syndrome (SIDS) precautions. MOB verbalized understanding and reported that infant would sleep in a basinet.     CSW and MOB discussed infant's NICU admission. MOB reported that it has been going good and provided CSW with an update on infant's medical status. MOB reported that she feels well informed about infant's care. CSW informed MOB about the NICU, what to expect and resources/supports available while infant is admitted to the NICU. MOB denied  any questions or concerns. MOB reported no transportation barriers. CSW provided MOB with a butterfly from [employer].   CSW informed MOB about hospital drug policy due to MOB having no prenatal care. MOB confirmed that she didn't have prenatal care due to moving from [place] to [place] and having difficulty switching over her medicaid. MOB reported that moving and not having medicaid were barriers to getting prenatal care. CSW inquired about substance use during pregnancy, MOB denied any substance use during pregnancy. CSW informed MOB that infant's UDS was negative and CDS would continue to be monitored and a CPS report would be made if warranted.CSW asked if MOB had any CPS history, MOB reported that she had a case in [place] which is now closed. MOB reported that she had a case about "one year and some change" ago. MOB reported that her family member called CPS after she was was kicked out of her aunt's home and reported that she was homeless. MOB reported that she moved in with FOB's mother after being kicked out of her aunt's home. MOB reported that DSS CPS assisted her with obtaining housing and child care while she worked. MOB reported that the case is now closed. CSW followed up with [place] DSS CPS who confirmed that MOB does not have an open case with  County DSS CPS.  CSW will continue to offer support/resources while infant is admitted to the NICU.   CSW Plan/Description:  Sudden Infant Death Syndrome (SIDS) Education, Perinatal Mood and Anxiety Disorder (PMADs) Education, Hospital Drug Screen Policy Information, CSW Will Continue to Monitor Umbilical Cord Tissue Drug Screen Results and Make Report if Warranted, Other Patient/Family Education    Dashanique Brownstein L Shyann Hefner, LCSW 11/18/2018, 11:40 AM 

## 2018-11-18 NOTE — Discharge Summary (Signed)
Postpartum Discharge Summary     Patient Name: Mary Underwood DOB: March 16, 1996 MRN: [id number]  Date of admission: 11/17/2018 Delivering Provider: Derwood KaplanNANAVATI, ANKIT   Date of discharge: 11/18/2018  Admitting diagnosis: Preterm contractions [O47.9] Intrauterine pregnancy: 1546w6d     Secondary diagnosis:  Principal Problem:   Preterm delivery, delivered Active Problems:   Postpartum care following vaginal delivery   No prenatal care in current pregnancy   Short interval between pregnancies affecting pregnancy, antepartum   Hemoglobin C trait (HCC)   Rh negative state in antepartum period   Preterm contractions  Additional problems: n/a     Discharge diagnosis: Preterm Pregnancy Delivered                                                                                                Post partum procedures:depo  Augmentation: none  Complications: None  Hospital course:  Onset of Labor With Vaginal Delivery     23 y.o. yo [id number] at 4246w6d was admitted in Active Labor on 11/17/2018. Patient had an uncomplicated labor course as follows:  Membrane Rupture Time/Date: 4:35 AM ,11/17/2018   Intrapartum Procedures: Episiotomy: None [1]                                         Lacerations:  Vaginal [6]  Patient had a delivery of a Viable infant. 11/17/2018  Information for the patient's newborn:  Vevelyn PatBell, Boy Tienna [[id number]][[id number]]  Delivery Method: Vaginal, Spontaneous(Filed from Delivery Summary)    Pateint had an uncomplicated postpartum course.  She is ambulating, tolerating a regular diet, passing flatus, and urinating well. Patient is discharged home in stable condition on 11/18/18.   Magnesium Sulfate recieved: No BMZ received: No  Physical exam  Vitals:   11/17/18 1942 11/18/18 0009 11/18/18 0527 11/18/18 0845  BP: (!) 98/54 115/80 (!) 92/50 108/63  Pulse: 80 75 (!) 57 64  Resp:  17 16 18   Temp: 98.1 F (36.7 C) 98.2 F (36.8 C) 98.4 F (36.9 C) 97.7 F (36.5 C)  TempSrc:  Oral Oral Oral Oral  SpO2: 98% 99% 98% 97%  Weight:      Height:       General: alert, cooperative and no distress Lochia: appropriate Uterine Fundus: firm Incision: N/A DVT Evaluation: No evidence of DVT seen on physical exam. Labs: Lab Results  Component Value Date   WBC 16.1 (H) 11/17/2018   HGB 10.2 (L) 11/17/2018   HCT 28.5 (L) 11/17/2018   MCV 81.9 11/17/2018   PLT 269 11/17/2018   CMP Latest Ref Rng & Units 11/17/2018  Glucose 70 - 99 mg/dL 130(Q106(H)  BUN 6 - 20 mg/dL 5(L)  Creatinine 6.570.44 - 1.00 mg/dL 8.460.52  Sodium 962135 - 952145 mmol/L 135  Potassium 3.5 - 5.1 mmol/L 3.6  Chloride 98 - 111 mmol/L 107  CO2 22 - 32 mmol/L 20(L)  Calcium 8.9 - 10.3 mg/dL 8.4(X8.7(L)  Total Protein 6.5 - 8.1 g/dL 6.7  Total Bilirubin 0.3 - 1.2 mg/dL 0.4  Alkaline  Phos 38 - 126 U/L 177(H)  AST 15 - 41 U/L 13(L)  ALT 0 - 44 U/L 10    Discharge instruction: per After Visit Summary and "Baby and Me Booklet".  After visit meds:  Allergies as of 11/18/2018   No Known Allergies     Medication List    TAKE these medications   acetaminophen 325 MG tablet Commonly known as:  TYLENOL Take 325 mg by mouth every 6 (six) hours as needed for mild pain.   calcium carbonate 500 MG chewable tablet Commonly known as:  TUMS - dosed in mg elemental calcium Chew 2 tablets by mouth 3 (three) times daily as needed for indigestion or heartburn.   ibuprofen 600 MG tablet Commonly known as:  ADVIL Take 1 tablet (600 mg total) by mouth every 6 (six) hours.   prenatal multivitamin Tabs tablet Take 1 tablet by mouth daily at 12 noon.       Diet: routine diet  Activity: Advance as tolerated. Pelvic rest for 6 weeks.   Outpatient follow up:4 weeks Follow up Appt:No future appointments. Follow up Visit: Follow-up Information    [facility]. Schedule an appointment as soon as possible for a visit in 4 week(s).   Specialty:  Obstetrics and Gynecology Contact information: [address] [place] [address], [address] [id number] [place] [address] [phone]           Please schedule this patient for Postpartum visit in: 4 weeks with the following provider: Any provider For C/S patients schedule nurse incision check in weeks 2 weeks: no High risk pregnancy complicated by: no prenatal care Delivery mode:  SVD Anticipated Birth Control:  Depo PP Procedures needed: none  Schedule Integrated BH visit: yes   Newborn Data: Live born female  Birth Weight: 4 lb 12.5 oz (2170 g) APGAR: 8, 9  Newborn Delivery   Birth date/time:  11/17/2018 04:35:00 Delivery type:  Vaginal, Spontaneous     Baby Feeding: Bottle and Breast Disposition:NICU   11/18/2018 Conan Bowens, MD

## 2018-12-15 ENCOUNTER — Ambulatory Visit: Payer: Self-pay

## 2018-12-15 NOTE — Lactation Note (Addendum)
This note was copied from a baby's chart. Lactation Consultation Note  Patient Name: Mary Underwood Today's Date: 12/15/2018   This baby was formerly known as baby boy Gavina, but is now [employer].  He is getting all feeds via bottle now with either breast milk when mom brings in or Similac Special Care 24 cal formula.  Mom has a 21 yr old, 77 yr old and 68 year old.  He was transferred from [facility]) on Day 9 of life at 35+ weeks.  He is now 36+ weeks.  Mom not sure if wants to put baby to breast.  Ulice Bold is still having brady and DeSats with irregular heart patterns.     Maternal Data    Feeding Feeding Type: Bottle Fed - Breast Milk  LATCH Score                   Interventions    Lactation Tools Discussed/Used     Consult Status      Jarold Motto 12/15/2018, 10:48 PM

## 2019-01-08 ENCOUNTER — Telehealth: Payer: Self-pay | Admitting: *Deleted

## 2019-01-08 ENCOUNTER — Telehealth: Payer: Medicaid Other | Admitting: Obstetrics & Gynecology

## 2019-01-08 NOTE — Telephone Encounter (Signed)
Attempted to call pt to start virtual visit, left message to call us back

## 2019-01-08 NOTE — Progress Notes (Deleted)
Post Partum Exam  Mary Underwood is a 23 y.o. [phone] female who presents for a postpartum visit. She is {1-10:13787} {time; units:18646} postpartum following a {delivery:12449}. I have fully reviewed the prenatal and intrapartum course. The delivery was at *** gestational weeks.  Anesthesia: {anesthesia types:812}. Postpartum course has been ***. Baby's course has been ***. Baby is feeding by {breast/bottle:69}. Bleeding {vag bleed:12292}. Bowel function is {normal:32111}. Bladder function is {normal:32111}. Patient {is/is not:9024} sexually active. Contraception method is {contraceptive method:5051}. Postpartum depression screening:neg  {Common ambulatory SmartLinks:19316} Last pap smear done *** and was {Desc; normal/abnormal:11317::"Normal"}  Review of Systems {ros; complete:30496}    Objective:  unknown if currently breastfeeding.  General:  {gen appearance:16600}   Breasts:  {breast exam:1202::"inspection negative, no nipple discharge or bleeding, no masses or nodularity palpable"}  Lungs: {lung exam:16931}  Heart:  {heart exam:5510}  Abdomen: {abdomen exam:16834}   Vulva:  {labia exam:12198}  Vagina: {vagina exam:12200}  Cervix:  {cervix exam:14595}  Corpus: {uterus exam:12215}  Adnexa:  {adnexa exam:12223}  Rectal Exam: {rectal/vaginal exam:12274}        Assessment:    *** postpartum exam. Pap smear {done:10129} at today's visit.   Plan:   1. Contraception: {method:5051} 2. *** 3. Follow up in: {1-10:13787} {time; units:19136} or as needed.

## 2019-02-09 ENCOUNTER — Telehealth: Payer: Self-pay | Admitting: Radiology

## 2019-02-09 NOTE — Telephone Encounter (Signed)
Called patient and left message to call [facility] to reschedule virtual postpartum appointment. Patient was "no show " on 01/08/19

## 2019-02-10 ENCOUNTER — Other Ambulatory Visit: Payer: Self-pay | Admitting: *Deleted

## 2019-02-10 MED ORDER — MEDROXYPROGESTERONE ACETATE 150 MG/ML IM SUSP: 150.0000 mg | INTRAMUSCULAR | 2 refills | Status: DC

## 2019-02-12 ENCOUNTER — Other Ambulatory Visit: Payer: Self-pay

## 2019-02-12 ENCOUNTER — Encounter: Payer: Self-pay | Admitting: Family Medicine

## 2019-02-12 ENCOUNTER — Ambulatory Visit (INDEPENDENT_AMBULATORY_CARE_PROVIDER_SITE_OTHER): Payer: Medicaid Other | Admitting: Family Medicine

## 2019-02-12 DIAGNOSIS — Z1389 Encounter for screening for other disorder: Secondary | ICD-10-CM

## 2019-02-12 DIAGNOSIS — Z3042 Encounter for surveillance of injectable contraceptive: Secondary | ICD-10-CM

## 2019-02-12 MED ORDER — MEDROXYPROGESTERONE ACETATE 150 MG/ML IM SUSP
150.0000 mg | Freq: Once | INTRAMUSCULAR | Status: AC
  Administered 2019-02-12: 150 mg via INTRAMUSCULAR

## 2019-02-12 NOTE — Patient Instructions (Signed)
Contraceptive Injection A contraceptive injection is a shot that prevents pregnancy. It is also called the birth control shot. The shot contains the hormone progestin, which prevents pregnancy by:  Stopping the ovaries from releasing eggs.  Thickening cervical mucus to prevent sperm from entering the cervix.  Thinning the lining of the uterus to prevent a fertilized egg from attaching to the uterus. Contraceptive injections are given under the skin (subcutaneous) or into a muscle (intramuscular). For these shots to work, you must get one of them every 3 months (12 weeks) from a health care provider. Tell a health care provider about:  Any allergies you have.  All medicines you are taking, including vitamins, herbs, eye drops, creams, and over-the-counter medicines.  Any blood disorders you have.  Any medical conditions you have.  Whether you are pregnant or may be pregnant. What are the risks? Generally, this is a safe procedure. However, problems may occur, including:  Mood changes or depression.  Loss of bone density (osteoporosis) after long-term use.  Blood clots.  Higher risk of an egg being fertilized outside your uterus (ectopic pregnancy).This is rare. What happens before the procedure?  Your health care provider may do a routine physical exam.  You may have a test to make sure you are not pregnant. What happens during the procedure?  The area where the shot will be given will be cleaned and sanitized with alcohol.  A needle will be inserted into a muscle in your upper arm or buttock, or into the skin of your thigh or abdomen. The needle will be attached to a syringe with the medicine inside of it.  The medicine will be pushed through the syringe and injected into your body.  A small bandage (dressing) may be placed over the injection site. What can I expect after the procedure?  After the procedure, it is common to have: ? Soreness around the injection site for  a couple of days. ? Irregular menstrual bleeding. ? Weight gain. ? Breast tenderness. ? Headaches. ? Discomfort in your abdomen.  Ask your health care provider whether you need to use an added method of birth control (backup contraception), such as a condom, sponge, or spermicide. ? If the first shot is given 1-7 days after the start of your last period, you will not need backup contraception. ? If the first shot is given at any other time during your menstrual cycle, you should avoid having sex or you will need backup contraception for 7 days after you receive the shot. Follow these instructions at home: General instructions   Take over-the-counter and prescription medicines only as told by your health care provider.  Do not massage the injection site.  Track your menstrual periods so you will know if they become irregular.  Always use a condom to protect against STIs (sexually transmitted infections).  Make sure you schedule an appointment in time for your next shot, and mark it on your calendar. For the birth control to prevent pregnancy, you must get the injections every 3 months (12 weeks). Lifestyle  Do not use any products that contain nicotine or tobacco, such as cigarettes and e-cigarettes. If you need help quitting, ask your health care provider.  Eat foods that are high in calcium and vitamin D, such as milk, cheese, and salmon. Doing this may help with any loss in bone density that is caused by the contraceptive injection. Ask your health care provider for dietary recommendations. Contact a health care provider if:  You   have nausea or vomiting.  You have abnormal vaginal discharge or bleeding.  You miss a period or you think you might be pregnant.  You experience mood changes or depression.  You feel dizzy or light-headed.  You have leg pain. Get help right away if:  You have chest pain.  You cough up blood.  You have shortness of breath.  You have a  severe headache that does not go away.  You have numbness in any part of your body.  You have slurred speech.  You have vision problems.  You have vaginal bleeding that is abnormally heavy or does not stop.  You have severe pain in your abdomen.  You have depression that does not get better with treatment. If you ever feel like you may hurt yourself or others, or have thoughts about taking your own life, get help right away. You can go to your nearest emergency department or call:  Your local emergency services (911 in the [place]  A suicide crisis helpline, such as the National Suicide Prevention Lifeline at 1-800-273-8255. This is open 24 hours a day. Summary  A contraceptive injection is a shot that prevents pregnancy. It is also called the birth control shot.  The shot is given under the skin (subcutaneous) or into a muscle (intramuscular).  After this procedure, it is common to have soreness around the injection site for a couple of days.  To prevent pregnancy, the shot must be given by a health care provider every 3 months (12 weeks).  After you have the shot, ask your health care provider whether you need to use an added method of birth control (backup contraception), such as a condom, sponge, or spermicide. This information is not intended to replace advice given to you by your health care provider. Make sure you discuss any questions you have with your health care provider. Document Released: 02/18/2017 Document Revised: 06/07/2017 Document Reviewed: 02/18/2017 Elsevier Patient Education  2020 [employer]vier Inc.  

## 2019-02-12 NOTE — Progress Notes (Signed)
Post Partum Exam  Mary Underwood is a 23 y.o. [phone] female who presents for a postpartum visit. She is several weeks postpartum following a spontaneous vaginal delivery. I have fully reviewed the prenatal and intrapartum course. The delivery was at 32.6 gestational weeks.  Anesthesia: none. Postpartum course has been uncomplicated. Baby's course has been complicated by NICU admission due to preterm delivery. Baby is feeding by bottle - Carn[employer] Good Start. Bleeding no bleeding. Bowel function is normal. Bladder function is normal. Patient is sexually active. Contraception method is Depo-Provera injections. Postpartum depression screening:neg I have independently reviewed the above information and agree with the documentation.  The following portions of the patient's history were reviewed and updated as appropriate: allergies, current medications, past family history, past medical history, past social history, past surgical history and problem list. Last pap smear done unsure and was Normal  Review of Systems Pertinent items noted in HPI and remainder of comprehensive ROS otherwise negative.    Objective:  Blood pressure 116/78, pulse 90, height 5\' 6"  (1.676 m), weight 187 lb (84.8 kg), unknown if currently breastfeeding.  General:  alert, cooperative and appears stated age  Lungs: normal effort  Heart:  regular rate and rhythm  Abdomen: soft, non-tender; bowel sounds normal; no masses,  no organomegaly        Assessment:    Normal postpartum exam. Pap smear not done at today's visit.   Plan:   1. Contraception: Depo-Provera injections 2. Desires BTL--may sign papers when she is 23 years old 3. Follow up in: 3 months for Depo or as needed.

## 2019-03-31 ENCOUNTER — Emergency Department
Admission: EM | Admit: 2019-03-31 | Discharge: 2019-03-31 | Disposition: A | Discharge status: Home / Self Care | Payer: Medicaid Other | Attending: Emergency Medicine | Admitting: Emergency Medicine

## 2019-03-31 ENCOUNTER — Other Ambulatory Visit: Payer: Self-pay

## 2019-03-31 ENCOUNTER — Encounter: Payer: Self-pay | Admitting: Emergency Medicine

## 2019-03-31 DIAGNOSIS — Y929 Unspecified place or not applicable: Secondary | ICD-10-CM | POA: Diagnosis not present

## 2019-03-31 DIAGNOSIS — S1086XA Insect bite of other specified part of neck, initial encounter: Secondary | ICD-10-CM | POA: Diagnosis not present

## 2019-03-31 DIAGNOSIS — Y999 Unspecified external cause status: Secondary | ICD-10-CM | POA: Diagnosis not present

## 2019-03-31 DIAGNOSIS — F17210 Nicotine dependence, cigarettes, uncomplicated: Secondary | ICD-10-CM | POA: Diagnosis not present

## 2019-03-31 DIAGNOSIS — Y939 Activity, unspecified: Secondary | ICD-10-CM | POA: Diagnosis not present

## 2019-03-31 DIAGNOSIS — L089 Local infection of the skin and subcutaneous tissue, unspecified: Secondary | ICD-10-CM | POA: Diagnosis not present

## 2019-03-31 DIAGNOSIS — W57XXXA Bitten or stung by nonvenomous insect and other nonvenomous arthropods, initial encounter: Secondary | ICD-10-CM | POA: Diagnosis not present

## 2019-03-31 MED ORDER — NAPROXEN 500 MG PO TABS: 500.0000 mg | ORAL_TABLET | Freq: Two times a day (BID) | ORAL | Status: DC

## 2019-03-31 MED ORDER — SULFAMETHOXAZOLE-TRIMETHOPRIM 800-160 MG PO TABS: 1.0000 | ORAL_TABLET | Freq: Two times a day (BID) | ORAL | 0 refills | Status: DC

## 2019-03-31 NOTE — Discharge Instructions (Signed)
Follow discharge care instruction take medication as directed. °

## 2019-03-31 NOTE — ED Triage Notes (Signed)
PT arrives with complaints of possible abscess to pt's right neck/shoulder.

## 2019-03-31 NOTE — ED Provider Notes (Signed)
[facility] Emergency Department Provider Note   ____________________________________________   First MD Initiated Contact with Patient 03/31/19 1048     (approximate)  I have reviewed the triage vital signs and the nursing notes.   HISTORY  Chief Complaint Abscess    HPI Mary Underwood is a 23 y.o. female patient complain of pain to right lateral neck area secondary to insect bite 5 days ago.  Patient states increased swelling and a palpable lesion at site of bite.  Mild pain with palpation.  Patient denies fever chills associated complaint.  Patient rates her pain as a 4/10.  Patient described the pain as "sore".  No palliative measure for complaint.         Past Medical History:  Diagnosis Date  . Medical history non-contributory     There are no active problems to display for this patient.   Past Surgical History:  Procedure Laterality Date  . NO PAST SURGERIES      Prior to Admission medications   Medication Sig Start Date End Date Taking? Authorizing Provider  medroxyPROGESTERone (DEPO-PROVERA) 150 MG/ML injection Inject 1 mL (150 mg total) into the muscle every 3 (three) months. 02/10/19   Anyanwu, Jethro Bastos, MD  naproxen (NAPROSYN) 500 MG tablet Take 1 tablet (500 mg total) by mouth 2 (two) times daily with a meal. 03/31/19   Joni Reining, PA-C  sulfamethoxazole-trimethoprim (BACTRIM DS) 800-160 MG tablet Take 1 tablet by mouth 2 (two) times daily. 03/31/19   Joni Reining, PA-C    Allergies Patient has no known allergies.  No family history on file.  Social History Social History   Tobacco Use  . Smoking status: Current Some Day Smoker  . Smokeless tobacco: Never Used  Substance Use Topics  . Alcohol use: Not Currently  . Drug use: Not Currently    Review of Systems Constitutional: No fever/chills Eyes: No visual changes. ENT: No sore throat. Cardiovascular: Denies chest pain. Respiratory: Denies shortness of breath.  Gastrointestinal: No abdominal pain.  No nausea, no vomiting.  No diarrhea.  No constipation. Genitourinary: Negative for dysuria. Musculoskeletal: Negative for back pain. Skin: Nodule lesion left lateral neck. Neurological: Negative for headaches, focal weakness or numbness.   ____________________________________________   PHYSICAL EXAM:  VITAL SIGNS: ED Triage Vitals [03/31/19 1033]  Enc Vitals Group     BP 133/74     Pulse Rate 83     Resp 18     Temp 98.5 F (36.9 C)     Temp Source Oral     SpO2 99 %     Weight 187 lb 6.3 oz (85 kg)     Height 5\' 6"  (1.676 m)     Head Circumference      Peak Flow      Pain Score 4     Pain Loc      Pain Edu?      Excl. in GC?    Constitutional: Alert and oriented. Well appearing and in no acute distress. Neck:No cervical spine tenderness to palpation Hematological/Lymphatic/Immunilogical: No cervical lymphadenopathy. Cardiovascular: Normal rate, regular rhythm. Grossly normal heart sounds.  Good peripheral circulation. Respiratory: Normal respiratory effort.  No retractions. Lungs CTAB. Gastrointestinal: Soft and nontender. No distention. No abdominal bruits. No CVA tenderness. Musculoskeletal: No lower extremity tenderness nor edema.  No joint effusions. Neurologic:  Normal speech and language. No gross focal neurologic deficits are appreciated. No gait instability. Skin: Nodule lesion on erythematous base right lateral neck. Psychiatric:  Mood and affect are normal. Speech and behavior are normal.  ____________________________________________   LABS (all labs ordered are listed, but only abnormal results are displayed)  Labs Reviewed - No data to display ____________________________________________  EKG   ____________________________________________  RADIOLOGY  ED MD interpretation:    Official radiology report(s): No results found.  ____________________________________________   PROCEDURES  Procedure(s)  performed (including Critical Care):  Procedures   ____________________________________________   INITIAL IMPRESSION / ASSESSMENT AND PLAN / ED COURSE  As part of my medical decision making, I reviewed the following data within the [place] [place] was evaluated in Emergency Department on 03/31/2019 for the symptoms described in the history of present illness. She was evaluated in the context of the global COVID-19 pandemic, which necessitated consideration that the patient might be at risk for infection with the SARS-CoV-2 virus that causes COVID-19. Institutional protocols and algorithms that pertain to the evaluation of patients at risk for COVID-19 are in a state of rapid change based on information released by regulatory bodies including the CDC and federal and state organizations. These policies and algorithms were followed during the patient's care in the ED.   Patient presents with pain secondary to insect bite 5 days ago.  Patient given discharge care instruction advised take medication as directed.  Patient advised follow-up PCP if no improvement in 3 to 5 days.  Return to ED if condition worsens.      ____________________________________________   FINAL CLINICAL IMPRESSION(S) / ED DIAGNOSES  Final diagnoses:  Bug bite with infection, initial encounter     ED Discharge Orders         Ordered    sulfamethoxazole-trimethoprim (BACTRIM DS) 800-160 MG tablet  2 times daily     03/31/19 1054    naproxen (NAPROSYN) 500 MG tablet  2 times daily with meals     03/31/19 1054           Note:  This document was prepared using Dragon voice recognition software and may include unintentional dictation errors.    Sable Feil, PA-C 03/31/19 1106    Nance Pear, MD 03/31/19 [phone]

## 2019-04-27 ENCOUNTER — Ambulatory Visit: Payer: Medicaid Other | Admitting: Obstetrics and Gynecology

## 2019-04-30 ENCOUNTER — Ambulatory Visit: Payer: Medicaid Other

## 2019-05-11 ENCOUNTER — Other Ambulatory Visit: Payer: Self-pay

## 2019-05-11 ENCOUNTER — Ambulatory Visit (INDEPENDENT_AMBULATORY_CARE_PROVIDER_SITE_OTHER): Payer: Medicaid Other | Admitting: *Deleted

## 2019-05-11 VITALS — BP 114/74 | HR 79

## 2019-05-11 DIAGNOSIS — Z3042 Encounter for surveillance of injectable contraceptive: Secondary | ICD-10-CM

## 2019-05-11 MED ORDER — MEDROXYPROGESTERONE ACETATE 150 MG/ML IM SUSP
150.0000 mg | Freq: Once | INTRAMUSCULAR | Status: AC
  Administered 2019-05-11: 15:00:00 150 mg via INTRAMUSCULAR

## 2019-05-11 NOTE — Progress Notes (Signed)
Date last pap: unknown. Last Depo-Provera: 02/12/2019. Side Effects if any: none. Serum HCG indicated? NA. Depo-Provera 150 mg IM given by: Crosby Oyster, RN Next appointment due 07/27/2019-08/10/2019

## 2019-05-31 ENCOUNTER — Other Ambulatory Visit: Payer: Self-pay

## 2019-05-31 ENCOUNTER — Emergency Department
Admission: EM | Admit: 2019-05-31 | Discharge: 2019-05-31 | Disposition: A | Discharge status: Home / Self Care | Payer: Medicaid Other | Attending: Emergency Medicine | Admitting: Emergency Medicine

## 2019-05-31 DIAGNOSIS — Y929 Unspecified place or not applicable: Secondary | ICD-10-CM | POA: Diagnosis not present

## 2019-05-31 DIAGNOSIS — Z79899 Other long term (current) drug therapy: Secondary | ICD-10-CM | POA: Insufficient documentation

## 2019-05-31 DIAGNOSIS — Y939 Activity, unspecified: Secondary | ICD-10-CM | POA: Insufficient documentation

## 2019-05-31 DIAGNOSIS — W57XXXA Bitten or stung by nonvenomous insect and other nonvenomous arthropods, initial encounter: Secondary | ICD-10-CM | POA: Insufficient documentation

## 2019-05-31 DIAGNOSIS — S30860A Insect bite (nonvenomous) of lower back and pelvis, initial encounter: Secondary | ICD-10-CM | POA: Insufficient documentation

## 2019-05-31 DIAGNOSIS — F17210 Nicotine dependence, cigarettes, uncomplicated: Secondary | ICD-10-CM | POA: Diagnosis not present

## 2019-05-31 DIAGNOSIS — Y999 Unspecified external cause status: Secondary | ICD-10-CM | POA: Insufficient documentation

## 2019-05-31 MED ORDER — CEPHALEXIN 500 MG PO CAPS: 500.0000 mg | ORAL_CAPSULE | Freq: Three times a day (TID) | ORAL | 0 refills | Status: AC

## 2019-05-31 MED ORDER — CEPHALEXIN 500 MG PO CAPS
500.0000 mg | ORAL_CAPSULE | Freq: Once | ORAL | Status: AC
  Administered 2019-05-31: 18:00:00 500 mg via ORAL
  Filled 2019-05-31: qty 1

## 2019-05-31 NOTE — ED Triage Notes (Signed)
Pt c/po red swollen tender area to the left buttock for the past 3-4 days.

## 2019-05-31 NOTE — Discharge Instructions (Addendum)
You were seen today for an infected insect bite of the left buttock. We gave you your first dose of abx today. I have given you a RX for abx 3 x day for 7 days. You can also use Ibuprofen 400 mg every 8 hours as needed for pain. Apply warm compresses for 10 minutes 2 x day. Follow up if you have increased pain, redness, swelling, fever, nausea or vomiting.

## 2019-05-31 NOTE — ED Notes (Signed)
NAD noted at time of D/C. Pt denies questions or concerns. Pt ambulatory to the lobby at this time.  

## 2019-05-31 NOTE — ED Provider Notes (Addendum)
[facility] Emergency Department Provider Note ____________________________________________  Time seen: 1730  I have reviewed the triage vital signs and the nursing notes.  HISTORY  Chief Complaint  Insect Bite   HPI Mary Underwood is a 23 y.o. female presents to the ER today with complaint of insect bite to left buttock.  She noticed this 3 to 4 days ago.  She reports it is swollen and tender to touch.  She has not noticed any redness or warmth around the area.  She denies drainage from the area.  She has had some chills but denies fever or body aches.  She has used Hydrocortisone cream over-the-counter with minimal relief.  Past Medical History:  Diagnosis Date  . Medical history non-contributory     There are no active problems to display for this patient.   Past Surgical History:  Procedure Laterality Date  . NO PAST SURGERIES      Prior to Admission medications   Medication Sig Start Date End Date Taking? Authorizing Provider  cephALEXin (KEFLEX) 500 MG capsule Take 1 capsule (500 mg total) by mouth 3 (three) times daily for 10 days. 05/31/19 06/10/19  Jearld Fenton, NP  medroxyPROGESTERone (DEPO-PROVERA) 150 MG/ML injection Inject 1 mL (150 mg total) into the muscle every 3 (three) months. 02/10/19   Osborne Oman, MD    Allergies Patient has no known allergies.  No family history on file.  Social History Social History   Tobacco Use  . Smoking status: Current Some Day Smoker  . Smokeless tobacco: Never Used  Substance Use Topics  . Alcohol use: Not Currently  . Drug use: Not Currently    Review of Systems  Constitutional: Stiffer chills.  Negative for fever or body aches. Cardiovascular: Negative for chest pain or chest tightness. Respiratory: Negative for cough or shortness of breath. Gastrointestinal: Negative for nausea, vomiting and diarrhea. Skin: Positive for insect bite of left buttock. Neurological: Negative for focal  weakness, tingling or numbness. ____________________________________________  PHYSICAL EXAM:  VITAL SIGNS: ED Triage Vitals  Enc Vitals Group     BP 05/31/19 1409 119/81     Pulse Rate 05/31/19 1409 83     Resp 05/31/19 1409 16     Temp 05/31/19 1409 98.1 F (36.7 C)     Temp Source 05/31/19 1409 Oral     SpO2 05/31/19 1409 99 %     Weight 05/31/19 1413 203 lb 3.2 oz (92.2 kg)     Height 05/31/19 1409 5\' 6"  (1.676 m)     Head Circumference --      Peak Flow --      Pain Score 05/31/19 1409 8     Pain Loc --      Pain Edu? --      Excl. in [place]? --     Constitutional: Alert and oriented. Well appearing and in no distress. Cardiovascular: Normal rate, regular rhythm.  Respiratory: Normal respiratory effort. No wheezes/rales/rhonchi. Neurologic:  Normal speech and language. No gross focal neurologic deficits are appreciated. Skin: 1 cm abscess noted of left gluteal fold.  No surrounding cellulitis. ____________________________________________  INITIAL IMPRESSION / ASSESSMENT AND PLAN / ED COURSE  Infected Insect Bite of Left Buttock:  Keflex 500 mg PO x 1 RX for Keflex 500 mg TID x 7 days Can take Ibuprofen 400 mg as needed for pain and inflammation Apply warm compresses for 10 minutes BID  ____________________________________________  FINAL CLINICAL IMPRESSION(S) / ED DIAGNOSES  Final diagnoses:  Bug  bite with infection, initial encounter   Nicki Reaper, NP     Lorre Munroe, NP 05/31/19 1741    Lorre Munroe, NP 05/31/19 1831    Lorre Munroe, NP 05/31/19 1832    Lorre Munroe, NP 05/31/19 Maureen Chatters    Shaune Pollack, MD 06/01/19 [phone]

## 2019-07-27 ENCOUNTER — Ambulatory Visit: Payer: Medicaid Other

## 2019-10-23 ENCOUNTER — Ambulatory Visit: Payer: Medicaid Other | Admitting: Physician Assistant

## 2019-10-23 ENCOUNTER — Other Ambulatory Visit: Payer: Self-pay

## 2019-10-23 DIAGNOSIS — A5901 Trichomonal vulvovaginitis: Secondary | ICD-10-CM | POA: Diagnosis not present

## 2019-10-23 DIAGNOSIS — Z299 Encounter for prophylactic measures, unspecified: Secondary | ICD-10-CM

## 2019-10-23 DIAGNOSIS — Z113 Encounter for screening for infections with a predominantly sexual mode of transmission: Secondary | ICD-10-CM | POA: Diagnosis not present

## 2019-10-23 LAB — WET PREP FOR TRICH, YEAST, CLUE
Trichomonas Exam: POSITIVE — AB
Yeast Exam: NEGATIVE

## 2019-10-23 MED ORDER — METRONIDAZOLE 500 MG PO TABS: 2000.0000 mg | ORAL_TABLET | Freq: Once | ORAL | 0 refills | Status: AC

## 2019-10-23 MED ORDER — ACYCLOVIR 400 MG PO TABS: 400.0000 mg | ORAL_TABLET | Freq: Three times a day (TID) | ORAL | 0 refills | Status: AC

## 2019-10-23 NOTE — Progress Notes (Signed)
Wet mount reviewed by provider. Pt treated per provider orders. Provider orders completed.

## 2019-10-26 ENCOUNTER — Encounter: Payer: Self-pay | Admitting: Physician Assistant

## 2019-10-26 NOTE — Progress Notes (Signed)
[facility] Department STI clinic/screening visit  Subjective:  Mary Underwood is a 24 y.o. female being seen today for an STI screening visit. The patient reports they do have symptoms.  Patient reports that they do not desire a pregnancy in the next year.   They reported they are not interested in discussing contraception today.  No LMP recorded. Patient has had an injection.   Patient has the following medical conditions:  There are no problems to display for this patient.   Chief Complaint  Patient presents with  . SEXUALLY TRANSMITTED DISEASE    STD screening including bloodwork    HPI  Patient reports that she has had dysuria for about 10 days.  Also, reports some abdominal cramping off and on, vaginal odor and a "bump" on left labia for 3 days.  Denies other symptoms.  LMP 10/15/2019 and normal.  States that she d/c hormonal BCM about 5 months ago and is now using condoms most times as her BCM.    See flowsheet for further details and programmatic requirements.    The following portions of the patient's history were reviewed and updated as appropriate: allergies, current medications, past medical history, past social history, past surgical history and problem list.  Objective:  There were no vitals filed for this visit.  Physical Exam Constitutional:      General: She is not in acute distress.    Appearance: Normal appearance.  HENT:     Head: Normocephalic and atraumatic.     Comments: No nits, lice, or hair loss. No cervical, supraclavicular or axillary adenopathy.    Mouth/Throat:     Mouth: Mucous membranes are moist.     Pharynx: Oropharynx is clear. No oropharyngeal exudate or posterior oropharyngeal erythema.  Eyes:     Conjunctiva/sclera: Conjunctivae normal.  Pulmonary:     Effort: Pulmonary effort is normal.  Abdominal:     Palpations: There is no mass.     Tenderness: There is no abdominal tenderness. There is no guarding or rebound.   Genitourinary:    Rectum: Normal.     Comments: External genitalia/pubic area without nits, lice, edema, erythema and inguinal adenopathy. At the base of left labia majora 1 ~84mm shallow, ulcerative lesion, tender to culture collection. Vagina with normal mucosa, moderate amount of thin, yellowish, frothy discharge. Cervix with moderate erythema, friable and with whitish/grayish coating on lower half. Uterus firm, mobile, nt, no masses, no CMT, no adnexal tenderness or fullness.   Musculoskeletal:     Cervical back: Neck supple. No tenderness.  Skin:    General: Skin is warm and dry.     Findings: No bruising, erythema, lesion or rash.  Neurological:     Mental Status: She is alert and oriented to person, place, and time.  Psychiatric:        Mood and Affect: Mood normal.        Behavior: Behavior normal.        Thought Content: Thought content normal.        Judgment: Judgment normal.      Assessment and Plan:  Mary Underwood is a 24 y.o. female presenting to the [facility] Department for STI screening  1. Screening for STD (sexually transmitted disease) Patient into clinic with symptoms. Rec condoms with all sex. Await test results.  Counseled that RN will call if needs to RTC for further treatment once results are back.  - WET PREP FOR TRICH, YEAST, CLUE - Gonococcus culture - Chlamydia/Gonorrhea  [facility] - HIV [place] [facility] - Syphilis Serology, [facility] - Virology, [facility]  2. Prophylactic measure Counseled patient that HSV culture done due to lesion present on labia. Counseled re:  HSV- dz, transmission, cyclic nature, and treatment options.  No sex until after results are back and lesion has healed. Offered Acyclovir 400mg  #30 1 po TID for 10 days while waiting for test results to prevent further outbreak which patient accepts. Counseled patient to call with any questions or concerns.  - acyclovir (ZOVIRAX) 400 MG tablet; Take 1 tablet (400  mg total) by mouth 3 (three) times daily for 10 days.  Dispense: 30 tablet; Refill: 0  3. Trichomonal vaginitis Treat for Trich with Metronidazole 2 g po with food, no EtOH for 24 hr before and until 72 hr after completing medicine. No sex for 7 days and until after partner completes treatment. - metroNIDAZOLE (FLAGYL) 500 MG tablet; Take 4 tablets (2,000 mg total) by mouth once for 1 dose.  Dispense: 4 tablet; Refill: 0     No follow-ups on file.  No future appointments.  Jerene Dilling, PA

## 2019-10-27 ENCOUNTER — Ambulatory Visit: Payer: Medicaid Other

## 2019-10-27 LAB — GONOCOCCUS CULTURE

## 2019-11-02 ENCOUNTER — Telehealth: Payer: Self-pay

## 2019-11-02 DIAGNOSIS — B009 Herpesviral infection, unspecified: Secondary | ICD-10-CM | POA: Insufficient documentation

## 2019-11-02 HISTORY — DX: Herpesviral infection, unspecified: B00.9

## 2019-11-02 MED ORDER — ACYCLOVIR 800 MG PO TABS: 800.0000 mg | ORAL_TABLET | Freq: Three times a day (TID) | ORAL | 12 refills | Status: AC

## 2019-11-02 NOTE — Telephone Encounter (Signed)
TC to patient. Verified ID via password/SS#. Informed of positive HSV 2. Discussed disease process and tx options. Patient would like episodic tx.  Consult with Rolley Sims, [place]; per VO will call in Acyclovir 800mg  #6 1 po TID for 3 days as needed refill x 12.   Sent Rx to . [employer], RN

## 2020-03-09 ENCOUNTER — Emergency Department
Admission: EM | Admit: 2020-03-09 | Discharge: 2020-03-09 | Disposition: A | Discharge status: Home / Self Care | Payer: Medicaid Other | Attending: Emergency Medicine | Admitting: Emergency Medicine

## 2020-03-09 ENCOUNTER — Other Ambulatory Visit: Payer: Self-pay

## 2020-03-09 ENCOUNTER — Encounter: Payer: Self-pay | Admitting: *Deleted

## 2020-03-09 DIAGNOSIS — F17200 Nicotine dependence, unspecified, uncomplicated: Secondary | ICD-10-CM | POA: Insufficient documentation

## 2020-03-09 DIAGNOSIS — N390 Urinary tract infection, site not specified: Secondary | ICD-10-CM | POA: Diagnosis not present

## 2020-03-09 DIAGNOSIS — R109 Unspecified abdominal pain: Secondary | ICD-10-CM | POA: Diagnosis present

## 2020-03-09 DIAGNOSIS — M549 Dorsalgia, unspecified: Secondary | ICD-10-CM | POA: Diagnosis not present

## 2020-03-09 LAB — URINALYSIS, COMPLETE (UACMP) WITH MICROSCOPIC
Bilirubin Urine: NEGATIVE
Glucose, UA: NEGATIVE mg/dL
Hgb urine dipstick: NEGATIVE
Ketones, ur: NEGATIVE mg/dL
Nitrite: POSITIVE — AB
Protein, ur: 100 mg/dL — AB
Specific Gravity, Urine: 1.023 (ref 1.005–1.030)
WBC, UA: >50 WBC/hpf — ABNORMAL HIGH (ref 0–5)
pH: 6 (ref 5.0–8.0)

## 2020-03-09 LAB — CBC
HCT: 30.9 % — ABNORMAL LOW (ref 36.0–46.0)
Hemoglobin: 11.6 g/dL — ABNORMAL LOW (ref 12.0–15.0)
MCH: 29.7 pg (ref 26.0–34.0)
MCHC: 37.5 g/dL — ABNORMAL HIGH (ref 30.0–36.0)
MCV: 79 fL — ABNORMAL LOW (ref 80.0–100.0)
Platelets: 320 10*3/uL (ref 150–400)
RBC: 3.91 MIL/uL (ref 3.87–5.11)
RDW: 12.3 % (ref 11.5–15.5)
WBC: 8.6 10*3/uL (ref 4.0–10.5)
nRBC: 0 % (ref 0.0–0.2)

## 2020-03-09 LAB — COMPREHENSIVE METABOLIC PANEL
ALT: 15 U/L (ref 0–44)
AST: 17 U/L (ref 15–41)
Albumin: 3.6 g/dL (ref 3.5–5.0)
Alkaline Phosphatase: 49 U/L (ref 38–126)
Anion gap: 8 (ref 5–15)
BUN: <5 mg/dL — ABNORMAL LOW (ref 6–20)
CO2: 22 mmol/L (ref 22–32)
Calcium: 9.1 mg/dL (ref 8.9–10.3)
Chloride: 105 mmol/L (ref 98–111)
Creatinine, Ser: 0.49 mg/dL (ref 0.44–1.00)
GFR calc Af Amer: >60 mL/min (ref >60)
GFR calc non Af Amer: >60 mL/min (ref >60)
Glucose, Bld: 122 mg/dL — ABNORMAL HIGH (ref 70–99)
Potassium: 3.4 mmol/L — ABNORMAL LOW (ref 3.5–5.1)
Sodium: 135 mmol/L (ref 135–145)
Total Bilirubin: 0.5 mg/dL (ref 0.3–1.2)
Total Protein: 7 g/dL (ref 6.5–8.1)

## 2020-03-09 LAB — POCT PREGNANCY, URINE: Preg Test, Ur: POSITIVE — AB

## 2020-03-09 MED ORDER — CEPHALEXIN 500 MG PO CAPS: 500.0000 mg | ORAL_CAPSULE | Freq: Two times a day (BID) | ORAL | 0 refills | Status: DC

## 2020-03-09 MED ORDER — CEPHALEXIN 500 MG PO CAPS
500.0000 mg | ORAL_CAPSULE | Freq: Once | ORAL | Status: AC
  Administered 2020-03-09: 500 mg via ORAL
  Filled 2020-03-09: qty 1

## 2020-03-09 NOTE — ED Triage Notes (Signed)
Pt reports pressure when urinating  Pt is [redacted] weeks pregnant.  No vag bleeding.  Pt has abd pain.  Pt alert  [id number]

## 2020-03-09 NOTE — ED Provider Notes (Signed)
[facility] Emergency Department Provider Note   ____________________________________________    I have reviewed the triage vital signs and the nursing notes.   HISTORY  Chief Complaint Abdominal Pain and Back Pain     HPI Mary Underwood is a 24 y.o. female, this is her sixth pregnancy.  She complains of pressure suprapubically after urinating which started yesterday.  She denies vaginal discharge.  No nausea or vomiting.  No severe abdominal pain.  Discomfort only occurs after urinating.  No flank pain or back pain.  Has not taken anything for this.  Does report she had urinary tract infection over a month ago and completed a course of antibiotics.  Review of medical records demonstrates the patient did have an E. coli infection which was susceptible to multiple antibiotics, she was treated with Augmentin at that time.  At the time however she was not having urinary symptoms Past Medical History:  Diagnosis Date  . Medical history non-contributory     Patient Active Problem List   Diagnosis Date Noted  . 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 NO PAST SURGERIES      Prior to Admission medications   Medication Sig Start Date End Date Taking? Authorizing Provider  cephALEXin (KEFLEX) 500 MG capsule Take 1 capsule (500 mg total) by mouth 2 (two) times daily. 03/09/20   Jene Every, MD  medroxyPROGESTERone (DEPO-PROVERA) 150 MG/ML injection Inject 1 mL (150 mg total) into the muscle every 3 (three) months. 02/10/19   Tereso Newcomer, MD     Allergies Patient has no known allergies.  No family history on file.  Social History Social History   Tobacco Use  . Smoking status: Current Some Day Smoker  . Smokeless tobacco: Never Used  Vaping Use  . Vaping Use: Never used  Substance Use Topics  . Alcohol use: Not Currently  . Drug use: Not Currently    Review of Systems  Constitutional: No  fever/chills Eyes: No visual changes.  ENT: No sore throat. Cardiovascular: Denies chest pain. Respiratory: Denies shortness of breath. Gastrointestinal:  No nausea, no vomiting.   Genitourinary: As above Musculoskeletal: Negative for back pain. Skin: Negative for rash. Neurological: Negative for headaches or weakness   ____________________________________________   PHYSICAL EXAM:  VITAL SIGNS: ED Triage Vitals [03/09/20 1551]  Enc Vitals Group     BP 128/74     Pulse Rate (!) 107     Resp 18     Temp 98.5 F (36.9 C)     Temp Source Oral     SpO2 96 %     Weight 77.1 kg (170 lb)     Height 1.702 m (5\' 7" )     Head Circumference      Peak Flow      Pain Score 0     Pain Loc      Pain Edu?      Excl. in GC?     Constitutional: Alert and oriented.  Nose: No congestion/rhinnorhea. Mouth/Throat: Mucous membranes are moist.    Cardiovascular: Normal rate, regular rhythm. Grossly normal heart sounds.  Good peripheral circulation. Respiratory: Normal respiratory effort.  No retractions. Lungs CTAB. Gastrointestinal: Soft and nontender. No distention.  No CVA tenderness. Genitourinary: Mild tenderness suprapubically Musculoskeletal: No lower extremity tenderness nor edema.  Warm and well perfused Neurologic:  Normal speech and language. No gross focal neurologic deficits are appreciated.  Skin:  Skin is warm,  dry and intact. No rash noted. Psychiatric: Mood and affect are normal. Speech and behavior are normal.  ____________________________________________   LABS (all labs ordered are listed, but only abnormal results are displayed)  Labs Reviewed  COMPREHENSIVE METABOLIC PANEL - Abnormal; Notable for the following components:      Result Value   Potassium 3.4 (*)    Glucose, Bld 122 (*)    BUN <5 (*)    All other components within normal limits  CBC - Abnormal; Notable for the following components:   Hemoglobin 11.6 (*)    HCT 30.9 (*)    MCV 79.0 (*)     MCHC 37.5 (*)    All other components within normal limits  URINALYSIS, COMPLETE (UACMP) WITH MICROSCOPIC - Abnormal; Notable for the following components:   Color, Urine AMBER (*)    APPearance CLOUDY (*)    Protein, ur 100 (*)    Nitrite POSITIVE (*)    Leukocytes,Ua LARGE (*)    WBC, UA >50 (*)    Bacteria, UA MANY (*)    All other components within normal limits  POCT PREGNANCY, URINE - Abnormal; Notable for the following components:   Preg Test, Ur POSITIVE (*)    All other components within normal limits  POC URINE PREG, ED   ____________________________________________  EKG  None ____________________________________________  RADIOLOGY  None ____________________________________________   PROCEDURES  Procedure(s) performed: No  Procedures   Critical Care performed: No ____________________________________________   INITIAL IMPRESSION / ASSESSMENT AND PLAN / ED COURSE  Pertinent labs & imaging results that were available during my care of the patient were reviewed by me and considered in my medical decision making (see chart for details).  Patient presents with symptoms as noted above.  Suspicious for urinary tract infection, less likely pyelonephritis given complaints.  No vaginal bleeding  Lab work is quite reassuring, normal white blood cell count.  Normal chemistries  Has had ultrasound which demonstrated IUP reportedly  Urinalysis demonstrates positive nitrites positive leukocytes greater than 50 white blood cells with 0-5 squamous epithelial cells consistent with urinary tract infection.  I suspect she is having bladder spasm after urinating which is causing her symptoms.  We will start her on Keflex and have asked her to follow-up closely with her OB/GYN    ____________________________________________   FINAL CLINICAL IMPRESSION(S) / ED DIAGNOSES  Final diagnoses:  Lower urinary tract infectious disease        Note:  This document was  prepared using Dragon voice recognition software and may include unintentional dictation errors.   Jene Every, MD 03/09/20 2008

## 2020-03-09 NOTE — ED Notes (Signed)
Pt unable to void at this time. 

## 2020-04-20 ENCOUNTER — Ambulatory Visit: Payer: Medicaid Other | Admitting: Advanced Practice Midwife

## 2020-04-27 ENCOUNTER — Ambulatory Visit (INDEPENDENT_AMBULATORY_CARE_PROVIDER_SITE_OTHER): Payer: Medicaid Other | Admitting: Student

## 2020-04-27 ENCOUNTER — Other Ambulatory Visit: Payer: Self-pay

## 2020-04-27 ENCOUNTER — Encounter: Payer: Self-pay | Admitting: Student

## 2020-04-27 ENCOUNTER — Ambulatory Visit: Payer: Medicaid Other | Admitting: Student

## 2020-04-27 VITALS — BP 129/90 | HR 74 | Wt 161.0 lb

## 2020-04-27 DIAGNOSIS — Z3042 Encounter for surveillance of injectable contraceptive: Secondary | ICD-10-CM | POA: Diagnosis not present

## 2020-04-27 LAB — POCT URINE PREGNANCY: Preg Test, Ur: POSITIVE — AB

## 2020-04-27 MED ORDER — MEDROXYPROGESTERONE ACETATE 150 MG/ML IM SUSP
150.0000 mg | Freq: Once | INTRAMUSCULAR | Status: AC
  Administered 2020-04-27: 150 mg via INTRAMUSCULAR

## 2020-04-27 MED ORDER — MEDROXYPROGESTERONE ACETATE 150 MG/ML IM SUSP: 150.0000 mg | INTRAMUSCULAR | 2 refills | Status: DC

## 2020-04-27 NOTE — Progress Notes (Signed)
Patient ID: Mary Underwood, female   DOB: 07/11/1995, 24 y.o.   MRN: [id number]  History:  Mary Underwood is a 24 y.o. [id number] who presents to clinic today for Depo. Last pap smear was in 03/2020 and was normal. She is still spotting from her TAB; denies pain. SHe has not had intercourse in two months.   The following portions of the patient's history were reviewed and updated as appropriate: allergies, current medications, family history, past medical history, social history, past surgical history and problem list.  Review of Systems:  Review of Systems  Constitutional: Negative.   HENT: Negative.   Respiratory: Negative.   Cardiovascular: Negative.   Skin: Negative.       Objective:  Physical Exam BP 129/90   Pulse 74   Wt 161 lb (73 kg)   LMP  (LMP Unknown)   Breastfeeding Unknown   BMI 25.22 kg/m  Physical Exam Constitutional:      Appearance: Normal appearance.  HENT:     Head: Normocephalic.  Pulmonary:     Effort: Pulmonary effort is normal.  Abdominal:     General: Abdomen is flat.  Genitourinary:    General: Normal vulva.  Musculoskeletal:        General: Normal range of motion.  Neurological:     Mental Status: She is alert.       Labs and Imaging No results found for this or any previous visit (from the past 24 hour(s)).  No results found.   Assessment & Plan:   1. Encounter for Depo-Provera contraception     -UPT is faintly positive, will give Depo and have patient come in for pregnancy test in two weeks. Patient is very clear that she has not had intercourse in 2 months and that the positive urine test is due to recent pregnancy termination 3 weeks.   Approximately 30 minutes of total time was spent with this patient on counseling and coordination of care.   Marylene Land, CNM 04/27/2020 3:23 PM

## 2020-04-27 NOTE — Progress Notes (Signed)
Terminated pregnancy on 10/5, would like to restart depo. Last unprotected sex was around 2 months ago

## 2020-05-11 ENCOUNTER — Ambulatory Visit: Payer: Medicaid Other

## 2020-07-13 ENCOUNTER — Ambulatory Visit: Payer: Medicaid Other

## 2020-07-14 ENCOUNTER — Other Ambulatory Visit: Payer: Self-pay

## 2020-07-14 ENCOUNTER — Ambulatory Visit (INDEPENDENT_AMBULATORY_CARE_PROVIDER_SITE_OTHER): Payer: Medicaid Other

## 2020-07-14 VITALS — BP 120/79 | HR 58

## 2020-07-14 DIAGNOSIS — Z3042 Encounter for surveillance of injectable contraceptive: Secondary | ICD-10-CM

## 2020-07-14 MED ORDER — MEDROXYPROGESTERONE ACETATE 150 MG/ML IM SUSP
150.0000 mg | Freq: Once | INTRAMUSCULAR | Status: AC
  Administered 2020-07-14: 150 mg via INTRAMUSCULAR

## 2020-07-14 NOTE — Progress Notes (Signed)
Patient was assessed and managed by nursing staff during this encounter. I have reviewed the chart and agree with the documentation and plan. I have also made any necessary editorial changes.  Jaynie Collins, MD 07/14/2020 11:42 AM

## 2020-07-14 NOTE — Progress Notes (Signed)
Date last pap: 03/2020 Last Depo-Provera: 04/27/2020 Side Effects if any: None Serum HCG indicated? NA Depo-Provera 150 mg IM given by: Philemon Kingdom, CMA Next appointment due: March 24th- April 7th, 2022

## 2020-07-21 ENCOUNTER — Encounter: Payer: Self-pay | Admitting: Emergency Medicine

## 2020-07-21 ENCOUNTER — Other Ambulatory Visit: Payer: Self-pay

## 2020-07-21 ENCOUNTER — Emergency Department
Admission: EM | Admit: 2020-07-21 | Discharge: 2020-07-21 | Disposition: A | Discharge status: Home / Self Care | Payer: Medicaid Other | Attending: Emergency Medicine | Admitting: Emergency Medicine

## 2020-07-21 DIAGNOSIS — R350 Frequency of micturition: Secondary | ICD-10-CM | POA: Diagnosis present

## 2020-07-21 DIAGNOSIS — N390 Urinary tract infection, site not specified: Secondary | ICD-10-CM

## 2020-07-21 DIAGNOSIS — R030 Elevated blood-pressure reading, without diagnosis of hypertension: Secondary | ICD-10-CM | POA: Insufficient documentation

## 2020-07-21 DIAGNOSIS — F17200 Nicotine dependence, unspecified, uncomplicated: Secondary | ICD-10-CM | POA: Diagnosis not present

## 2020-07-21 LAB — URINALYSIS, COMPLETE (UACMP) WITH MICROSCOPIC
Bacteria, UA: NONE SEEN
Bilirubin Urine: NEGATIVE
Glucose, UA: NEGATIVE mg/dL
Ketones, ur: NEGATIVE mg/dL
Nitrite: NEGATIVE
Protein, ur: 100 mg/dL — AB
RBC / HPF: >50 RBC/hpf — ABNORMAL HIGH (ref 0–5)
Specific Gravity, Urine: 1.018 (ref 1.005–1.030)
WBC, UA: >50 WBC/hpf — ABNORMAL HIGH (ref 0–5)
pH: 6 (ref 5.0–8.0)

## 2020-07-21 LAB — POC URINE PREG, ED: Preg Test, Ur: NEGATIVE

## 2020-07-21 MED ORDER — CEPHALEXIN 500 MG PO CAPS: 500.0000 mg | ORAL_CAPSULE | Freq: Three times a day (TID) | ORAL | 0 refills | Status: AC

## 2020-07-21 NOTE — ED Provider Notes (Signed)
[facility] Emergency Department Provider Note  ____________________________________________   Event Date/Time   First MD Initiated Contact with Patient 07/21/20 1155     (approximate)  I have reviewed the triage vital signs and the nursing notes.   HISTORY  Chief Complaint Urinary Tract Infection   HPI Mary Underwood is a 25 y.o. female without significant past medical history presents for assessment approximately 2 days of some increased urinary frequency associated with burning and some suprapubic discomfort.  Patient states she took some Azo at home but did not improve her symptoms.  She denies any fevers, chills, cough, back pain, chest pain, rash, headache, earache, sore throat or extremity pain.  No recent falls or injuries.  No abnormal vaginal bleeding or discharge.  Patient denies tobacco abuse or regular EtOH use.  She has had urinary tract infections in the past that feel very similar to her symptoms today.         Past Medical History:  Diagnosis Date  . Medical history non-contributory     Patient Active Problem List   Diagnosis Date Noted  . 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 NO PAST SURGERIES      Prior to Admission medications   Medication Sig Start Date End Date Taking? Authorizing Provider  cephALEXin (KEFLEX) 500 MG capsule Take 1 capsule (500 mg total) by mouth 3 (three) times daily for 5 days. 07/21/20 07/26/20 Yes Gilles Chiquito, MD  medroxyPROGESTERone (DEPO-PROVERA) 150 MG/ML injection Inject 1 mL (150 mg total) into the muscle every 3 (three) months. 04/27/20 07/21/20  Marylene Land, CNM    Allergies Patient has no known allergies.  History reviewed. No pertinent family history.  Social History Social History   Tobacco Use  . Smoking status: Current Some Day Smoker  . Smokeless tobacco: Never Used  Vaping Use  . Vaping Use: Never used  Substance Use  Topics  . Alcohol use: Not Currently  . Drug use: Not Currently    Review of Systems  Review of Systems  Constitutional: Negative for chills and fever.  HENT: Negative for sore throat.   Eyes: Negative for pain.  Respiratory: Negative for cough and stridor.   Cardiovascular: Negative for chest pain.  Gastrointestinal: Positive for abdominal pain and nausea. Negative for constipation and diarrhea.  Genitourinary: Positive for dysuria and frequency. Negative for flank pain.  Musculoskeletal: Negative for myalgias.  Skin: Negative for rash.  Neurological: Negative for seizures, loss of consciousness and headaches.  Psychiatric/Behavioral: Negative for suicidal ideas.  All other systems reviewed and are negative.     ____________________________________________   PHYSICAL EXAM:  VITAL SIGNS: ED Triage Vitals  Enc Vitals Group     BP 07/21/20 1053 (!) 137/91     Pulse Rate 07/21/20 1053 86     Resp 07/21/20 1053 18     Temp 07/21/20 1053 98.5 F (36.9 C)     Temp Source 07/21/20 1053 Oral     SpO2 07/21/20 1053 96 %     Weight 07/21/20 1049 140 lb (63.5 kg)     Height 07/21/20 1049 5\' 6"  (1.676 m)     Head Circumference --      Peak Flow --      Pain Score 07/21/20 1049 6     Pain Loc --      Pain Edu? --      Excl. in GC? --    Vitals:  07/21/20 1053  BP: (!) 137/91  Pulse: 86  Resp: 18  Temp: 98.5 F (36.9 C)  SpO2: 96%   Physical Exam Vitals and nursing note reviewed.  Constitutional:      General: She is not in acute distress.    Appearance: She is well-developed and well-nourished.  HENT:     Head: Normocephalic and atraumatic.     Right Ear: External ear normal.     Left Ear: External ear normal.     Nose: Nose normal.     Mouth/Throat:     Mouth: Mucous membranes are moist.  Eyes:     Conjunctiva/sclera: Conjunctivae normal.  Cardiovascular:     Rate and Rhythm: Normal rate and regular rhythm.     Heart sounds: No murmur  heard.   Pulmonary:     Effort: Pulmonary effort is normal. No respiratory distress.     Breath sounds: Normal breath sounds.  Abdominal:     Palpations: Abdomen is soft.     Tenderness: There is no abdominal tenderness.  Musculoskeletal:        General: No edema.     Cervical back: Neck supple.     Right lower leg: No edema.     Left lower leg: No edema.  Skin:    General: Skin is warm and dry.     Capillary Refill: Capillary refill takes less than 2 seconds.  Neurological:     Mental Status: She is alert and oriented to person, place, and time.  Psychiatric:        Mood and Affect: Mood and affect and mood normal.      ____________________________________________   LABS (all labs ordered are listed, but only abnormal results are displayed)  Labs Reviewed  URINALYSIS, COMPLETE (UACMP) WITH MICROSCOPIC - Abnormal; Notable for the following components:      Result Value   Color, Urine YELLOW (*)    APPearance TURBID (*)    Hgb urine dipstick LARGE (*)    Protein, ur 100 (*)    Leukocytes,Ua LARGE (*)    RBC / HPF >50 (*)    WBC, UA >50 (*)    All other components within normal limits  URINE CULTURE  POC URINE PREG, ED   ____________________________________________  ____________________________________________   PROCEDURES  Procedure(s) performed (including Critical Care):  Procedures   ____________________________________________   INITIAL IMPRESSION / ASSESSMENT AND PLAN / ED COURSE      Patient presents with above-stated exam for assessment of couple days of dysuria and some suprapubic discomfort.  He is afebrile hemodynamically stable on arrival.  Her abdomen is soft nontender throughout and she has no CVA tenderness.  Concern for likely cystitis.  No CVA tenderness or fever to suggest pyelonephritis.  No significant historical or exam findings to suggest kidney stone, PID, TOA, appendicitis, diverticulitis, or other immediately acute intra-abdominal  pathology.  Urine pregnancy test is negative and UA is consistent with cystitis.  Patient is stable for outpatient management.  Rx written for Keflex.  Discharged stable condition.  Strict return precautions advised and discussed   ____________________________________________   FINAL CLINICAL IMPRESSION(S) / ED DIAGNOSES  Final diagnoses:  Lower urinary tract infectious disease    Medications - No data to display   ED Discharge Orders         Ordered    cephALEXin (KEFLEX) 500 MG capsule  3 times daily        07/21/20 1200  Note:  This document was prepared using Dragon voice recognition software and may include unintentional dictation errors.   Gilles Chiquito, MD 07/21/20 [phone]

## 2020-07-21 NOTE — ED Notes (Signed)
Pt c/o irritation upon voiding starting yesterday. Pt states she took AZO at home for symptoms, but stopped because it turned her urine orange. Pt states that the pain got worse this morning so she came to the ED.

## 2020-07-21 NOTE — ED Triage Notes (Signed)
Pt comes into the ED via POV c/o UTI symptoms that started 2 days ago.  Pt has dysuria and increased frequency.  Denies any other problems.

## 2020-07-24 LAB — URINE CULTURE: Culture: 30000 — AB

## 2020-08-18 IMAGING — US US OB LIMITED
1 series · 14 of 28 positions shown · non-contrast
Comparison: none

CLINICAL DATA: Lower abdominal cramping in the first trimester
ultrasound.

EXAM:
LIMITED OBSTETRIC ULTRASOUND

[Series 1: us ob limited · 0.20mm/px · 14 of 31 slices shown]
[im 2/31]
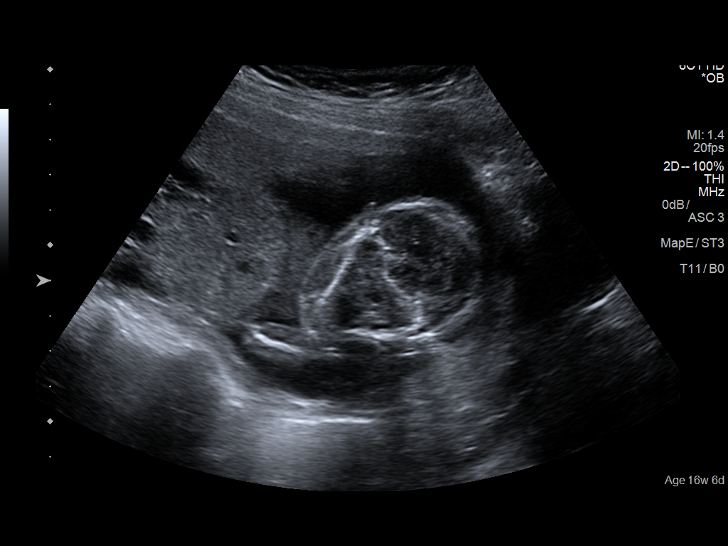
[im 4/31]
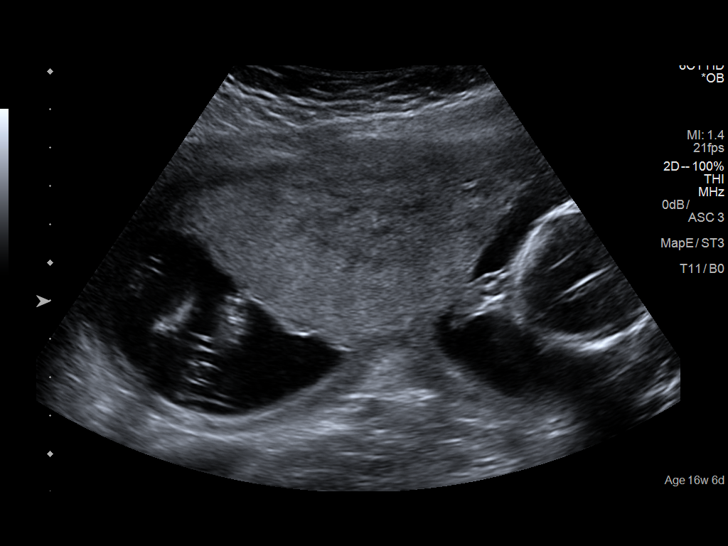
[im 6/31]
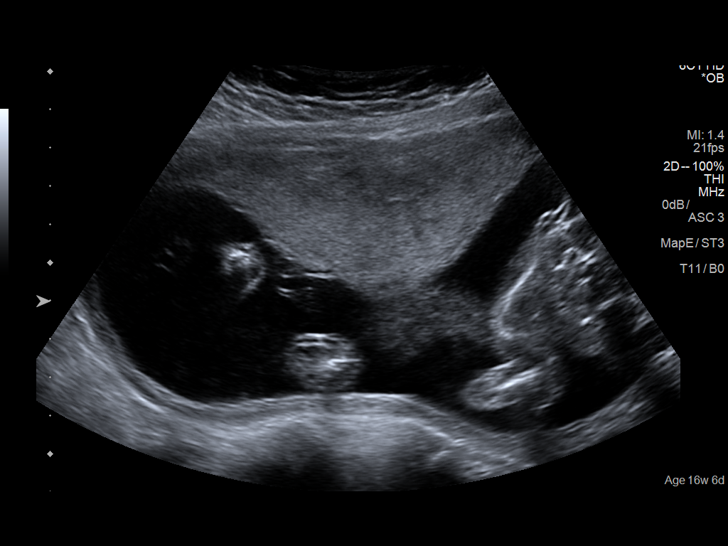
[im 8/31]
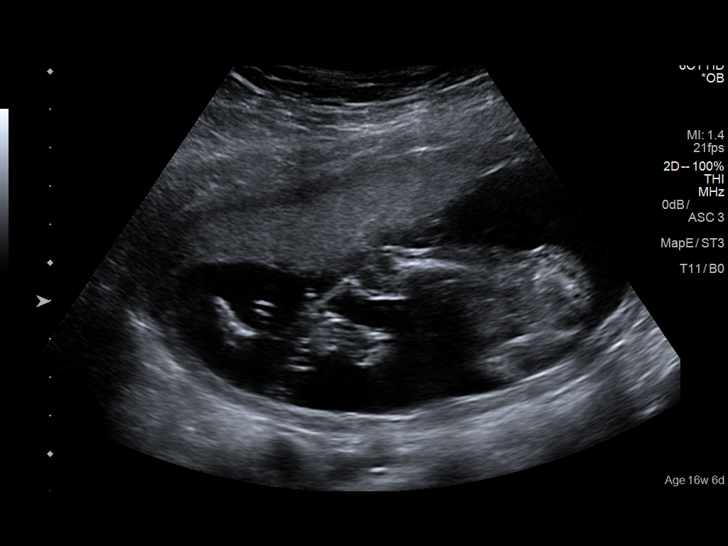
[im 11/31]
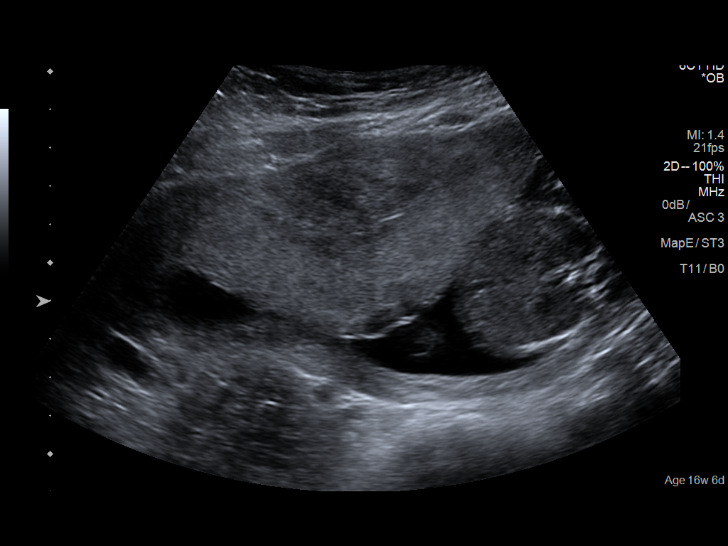
[im 13/31]
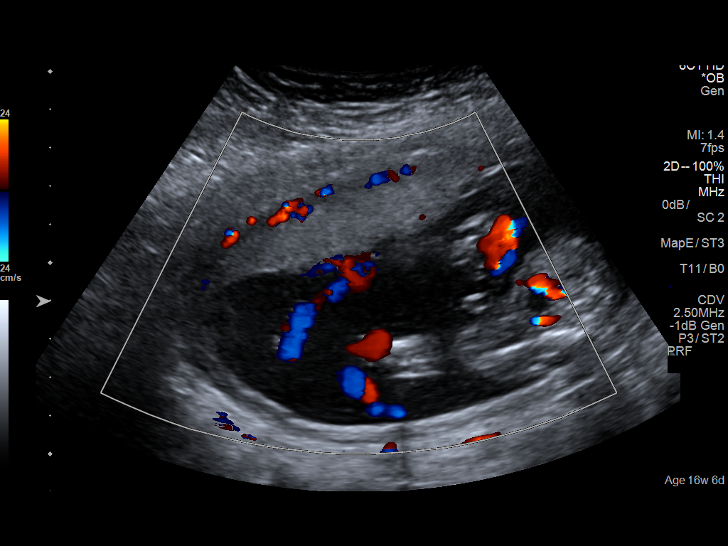
[im 15/31]
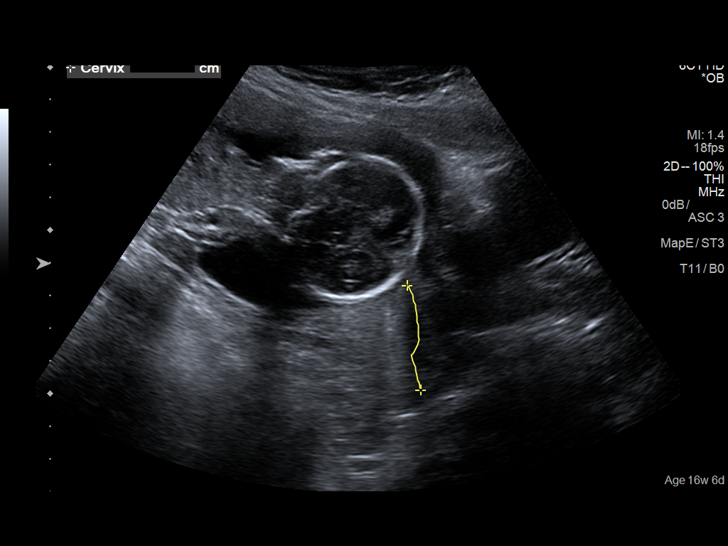
[im 17/31]
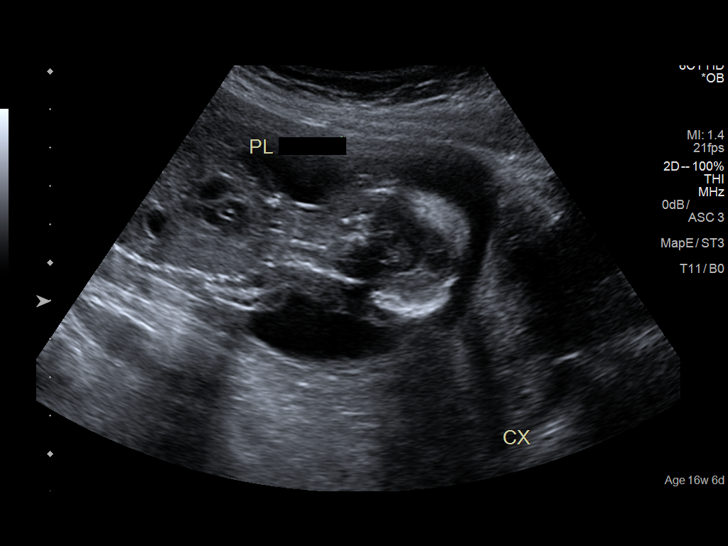
[im 19/31]
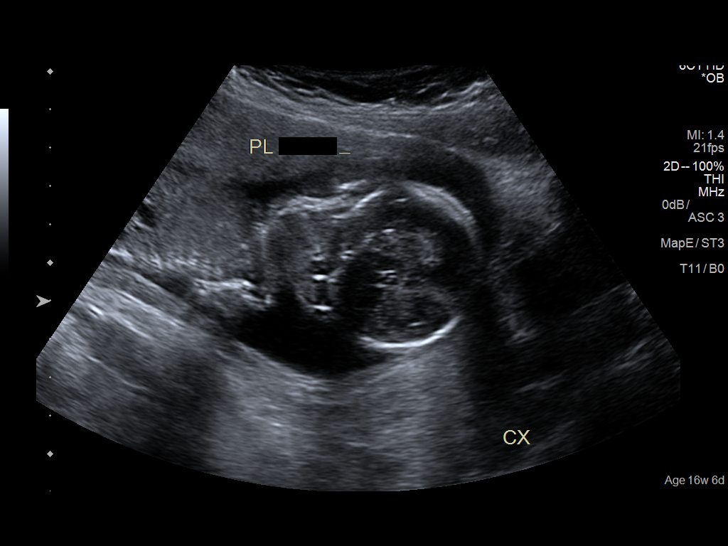
[im 22/31]
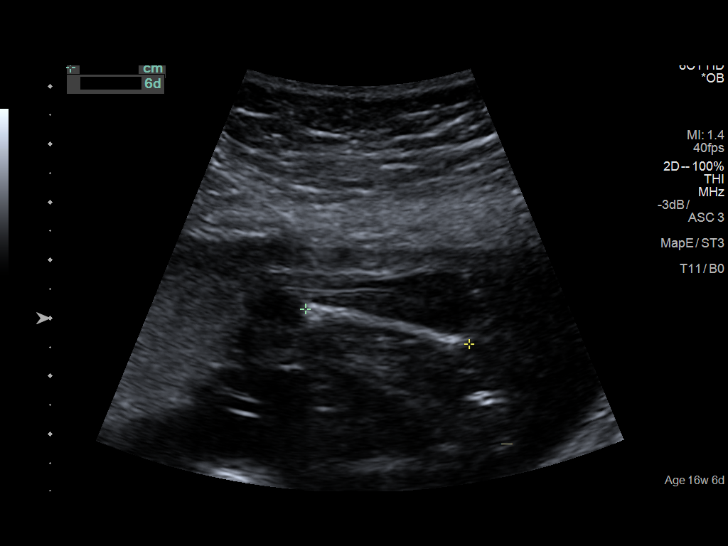
[im 24/31]
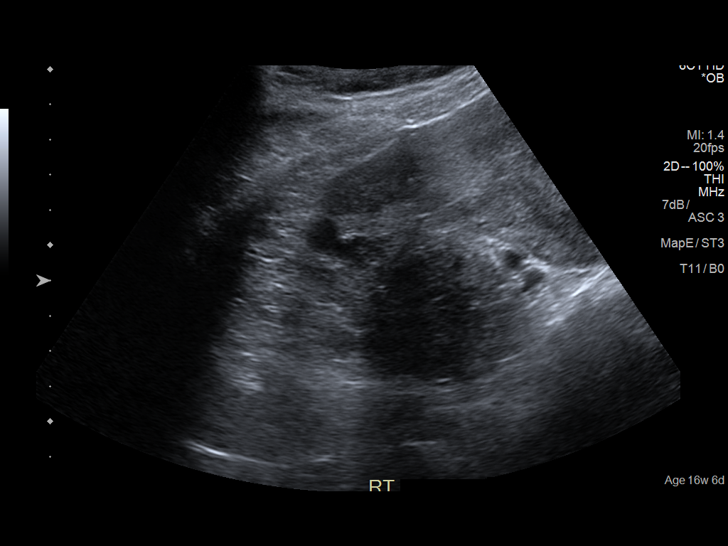
[im 26/31]
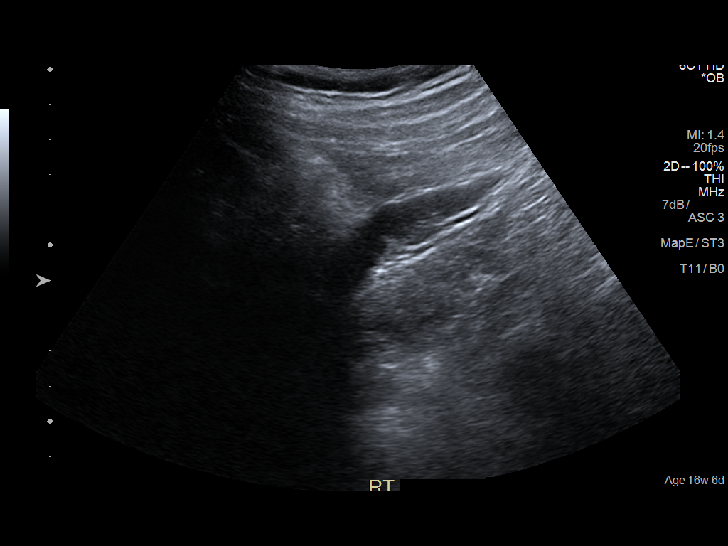
[im 28/31]
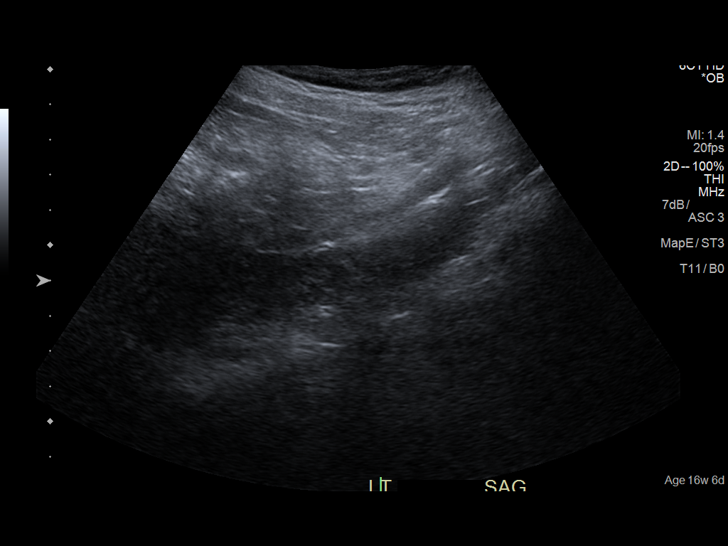
[im 31/31]
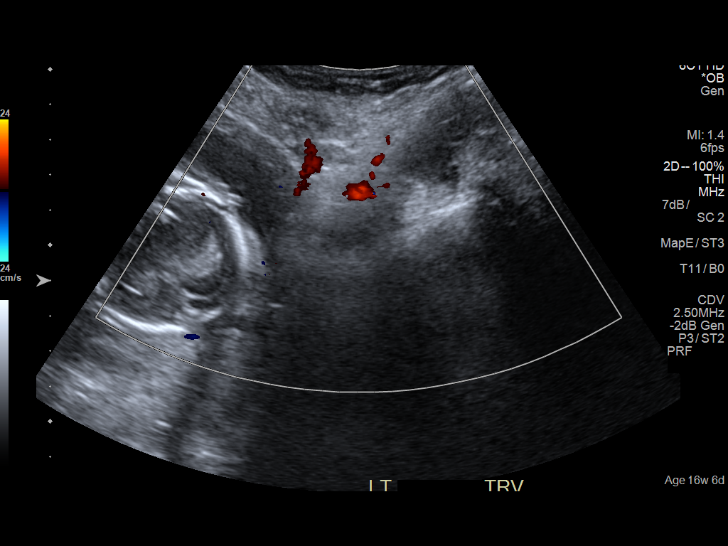

[14 of 28 positions shown; findings below may reference images not displayed]

FINDINGS: Number of Fetuses: 1

Heart Rate:  147 bpm

Movement: Yes

Presentation: Cephalic

Placental Location: Anterior

Previa: No

Amniotic Fluid (Subjective):  Within normal limits.

Femoral length: 2.8 cm 18 w  4 d

MATERNAL FINDINGS:

Cervix:  Closed measuring 3.4 cm in length.

Uterus/Adnexae: No abnormality visualized.
IMPRESSION: Viable single live intrauterine gestation at 18 weeks 4 days

This exam is performed on an emergent basis and does not
comprehensively evaluate fetal size, dating, or anatomy; follow-up
complete OB US should be considered if further fetal assessment is
warranted.

## 2020-09-29 ENCOUNTER — Ambulatory Visit (INDEPENDENT_AMBULATORY_CARE_PROVIDER_SITE_OTHER): Payer: Medicaid Other

## 2020-09-29 ENCOUNTER — Other Ambulatory Visit: Payer: Self-pay

## 2020-09-29 VITALS — BP 114/74 | HR 83 | Wt 176.0 lb

## 2020-09-29 DIAGNOSIS — Z3042 Encounter for surveillance of injectable contraceptive: Secondary | ICD-10-CM

## 2020-09-29 MED ORDER — MEDROXYPROGESTERONE ACETATE 150 MG/ML IM SUSP
150.0000 mg | Freq: Once | INTRAMUSCULAR | Status: AC
  Administered 2020-09-29: 150 mg via INTRAMUSCULAR

## 2020-09-29 NOTE — Progress Notes (Signed)
Patient seen and assessed by nursing staff.  Agree with documentation and plan.  

## 2020-09-29 NOTE — Progress Notes (Signed)
Date last pap: 03/2020 Last Depo-Provera: 07/14/2020 Side Effects if any: None Serum HCG indicated? NA Depo-Provera 150 mg IM given by: Philemon Kingdom, CMA Next appointment due: June 9th - 23rd, 2022

## 2020-12-30 ENCOUNTER — Ambulatory Visit: Payer: Medicaid Other

## 2021-01-02 ENCOUNTER — Ambulatory Visit (INDEPENDENT_AMBULATORY_CARE_PROVIDER_SITE_OTHER): Payer: Medicaid Other | Admitting: *Deleted

## 2021-01-02 ENCOUNTER — Other Ambulatory Visit: Payer: Self-pay

## 2021-01-02 ENCOUNTER — Ambulatory Visit: Payer: Medicaid Other

## 2021-01-02 VITALS — BP 119/75 | HR 61

## 2021-01-02 DIAGNOSIS — Z3042 Encounter for surveillance of injectable contraceptive: Secondary | ICD-10-CM | POA: Diagnosis not present

## 2021-01-02 MED ORDER — MEDROXYPROGESTERONE ACETATE 150 MG/ML IM SUSP
150.0000 mg | Freq: Once | INTRAMUSCULAR | Status: AC
  Administered 2021-01-02: 150 mg via INTRAMUSCULAR

## 2021-01-02 NOTE — Progress Notes (Signed)
Date last pap: NA. Last Depo-Provera: 09/29/2020. Side Effects if any: None. Serum HCG indicated? NA. Depo-Provera 150 mg IM given by: Scheryl Marten, RN . Next appointment due Sept 12-26th.

## 2021-01-04 NOTE — Progress Notes (Signed)
Patient was assessed and managed by nursing staff during this encounter. I have reviewed the chart and agree with the documentation and plan. I have also made any necessary editorial changes.  Latonda Larrivee, MD 01/04/2021 10:00 AM   

## 2021-02-22 ENCOUNTER — Ambulatory Visit: Payer: Medicaid Other | Admitting: Advanced Practice Midwife

## 2021-02-26 DIAGNOSIS — M7989 Other specified soft tissue disorders: Secondary | ICD-10-CM | POA: Diagnosis not present

## 2021-02-26 DIAGNOSIS — M79644 Pain in right finger(s): Secondary | ICD-10-CM | POA: Diagnosis not present

## 2021-02-26 DIAGNOSIS — L03011 Cellulitis of right finger: Secondary | ICD-10-CM | POA: Diagnosis not present

## 2021-02-26 DIAGNOSIS — L539 Erythematous condition, unspecified: Secondary | ICD-10-CM | POA: Diagnosis not present

## 2021-02-28 ENCOUNTER — Emergency Department
Admission: EM | Admit: 2021-02-28 | Discharge: 2021-02-28 | Disposition: A | Discharge status: Home / Self Care | Payer: Medicaid Other | Attending: Emergency Medicine | Admitting: Emergency Medicine

## 2021-02-28 ENCOUNTER — Other Ambulatory Visit: Payer: Self-pay

## 2021-02-28 ENCOUNTER — Encounter: Payer: Self-pay | Admitting: Emergency Medicine

## 2021-02-28 DIAGNOSIS — M7989 Other specified soft tissue disorders: Secondary | ICD-10-CM | POA: Diagnosis present

## 2021-02-28 DIAGNOSIS — F17200 Nicotine dependence, unspecified, uncomplicated: Secondary | ICD-10-CM | POA: Insufficient documentation

## 2021-02-28 DIAGNOSIS — L03011 Cellulitis of right finger: Secondary | ICD-10-CM | POA: Diagnosis not present

## 2021-02-28 MED ORDER — SULFAMETHOXAZOLE-TRIMETHOPRIM 800-160 MG PO TABS: 1.0000 | ORAL_TABLET | Freq: Two times a day (BID) | ORAL | 0 refills | Status: DC

## 2021-02-28 NOTE — ED Triage Notes (Signed)
C/O right thumb swelling.  Seen through [facility] ED for same.  STarted on Keflex.  Work up this morning, thumb seems to have more pain and swelling.  AAOx3.  Skin warm and dry. NAD

## 2021-02-28 NOTE — ED Notes (Signed)
See triage note  Presents with pain and swelling to right thumb  States she was seen at [facility] and placed on antibiotics  States area is worse today

## 2021-02-28 NOTE — ED Provider Notes (Signed)
[name] Emergency Department Provider Note  ____________________________________________  Time seen: Approximately 5:31 PM  I have reviewed the triage vital signs and the nursing notes.   HISTORY  Chief Complaint No chief complaint on file.    HPI Mary Underwood is a 25 y.o. female who presents the emergency department with worsening pain and swelling to her right thumb.  Patient been seen at [facility] 2 days ago and diagnosed with forming paronychia.  At that time there was not enough fluid collection to drain.  Patient states that this started after pulling a hangnail from the side of the nail of her right thumb.  She has had increased pain and edema since, has been on Keflex since her visit to [facility].  No new injuries or complaints.  Only concern at this time is worsening pain and edema to the thumb.       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 Authorizing Provider  cephALEXin (KEFLEX) 500 MG capsule Take 500 mg by mouth 4 (four) times daily.   Yes [provider]  sulfamethoxazole-trimethoprim (BACTRIM DS) 800-160 MG tablet Take 1 tablet by mouth 2 (two) times daily. 02/28/21  Yes Vandy Tsuchiya, Delorise Royals, PA-C  medroxyPROGESTERone (DEPO-PROVERA) 150 MG/ML injection Inject 1 mL (150 mg total) into the muscle every 3 (three) months. 04/27/20 07/21/20  Marylene Land, CNM    Allergies Patient has no known allergies.  No family history on file.  Social History Social History   Tobacco Use   Smoking status: Some Days   Smokeless tobacco: Never  Vaping Use   Vaping Use: Never used  Substance Use Topics   Alcohol use: Not Currently   Drug  use: Not Currently     Review of Systems  Constitutional: No fever/chills Eyes: No visual changes. No discharge ENT: No upper respiratory complaints. Cardiovascular: no chest pain. Respiratory: no cough. No SOB. Gastrointestinal: No abdominal pain.  No nausea, no vomiting.  No diarrhea.  No constipation. Musculoskeletal: Worsening pain, erythema and edema to the right thumb Skin: Negative for rash, abrasions, lacerations, ecchymosis. Neurological: Negative for headaches, focal weakness or numbness.  10 System ROS otherwise negative.  ____________________________________________   PHYSICAL EXAM:  VITAL SIGNS: ED Triage Vitals  Enc Vitals Group     BP 02/28/21 1403 121/76     Pulse Rate 02/28/21 1403 80     Resp 02/28/21 1403 20     Temp 02/28/21 1403 98.6 F (37 C)     Temp Source 02/28/21 1403 Oral     SpO2 02/28/21 1403 99 %     Weight 02/28/21 1403 180 lb (81.6 kg)     Height 02/28/21 1403 5\' 6"  (1.676 m)     Head Circumference --      Peak Flow --      Pain Score 02/28/21 1407 7     Pain Loc --      Pain Edu? --      Excl. in GC? --      Constitutional: Alert and oriented. Well appearing and in no acute distress. Eyes: Conjunctivae are normal. PERRL. EOMI. Head: Atraumatic. ENT:      Ears:  Nose: No congestion/rhinnorhea.      Mouth/Throat: Mucous membranes are moist.  Neck: No stridor.    Cardiovascular: Normal rate, regular rhythm. Normal S1 and S2.  Good peripheral circulation. Respiratory: Normal respiratory effort without tachypnea or retractions. Lungs CTAB. Good air entry to the bases with no decreased or absent breath sounds. Musculoskeletal: Full range of motion to all extremities. No gross deformities appreciated.  Visualization of the right thumb reveals mild edema and erythema along the medial aspect of the thumb adjacent to the nail.  There is some mild fluctuance to the area concerning for developing paronychia.  No tenderness or fluctuance  over the pad to be concern for felon's at this time. Neurologic:  Normal speech and language. No gross focal neurologic deficits are appreciated.  Skin:  Skin is warm, dry and intact. No rash noted. Psychiatric: Mood and affect are normal. Speech and behavior are normal. Patient exhibits appropriate insight and judgement.   ____________________________________________   LABS (all labs ordered are listed, but only abnormal results are displayed)  Labs Reviewed - No data to display ____________________________________________  EKG   ____________________________________________  RADIOLOGY   No results found.  ____________________________________________    PROCEDURES  Procedure(s) performed:    Marland KitchenMarland KitchenIncision and Drainage  Date/Time: 02/28/2021 5:42 PM Performed by: Racheal Patches, PA-C Authorized by: Racheal Patches, PA-C   Consent:    Consent obtained:  Verbal   Consent given by:  Patient   Risks, benefits, and alternatives were discussed: yes     Risks discussed:  Bleeding, incomplete drainage and pain Universal protocol:    Procedure explained and questions answered to patient or proxy's satisfaction: yes     Immediately prior to procedure, a time out was called: yes     Patient identity confirmed:  Verbally with patient Location:    Indications for incision and drainage: paronychia.   Location:  Upper extremity   Upper extremity location:  Finger   Finger location:  R thumb Pre-procedure details:    Skin preparation:  Povidone-iodine and chlorhexidine with alcohol Sedation:    Sedation type:  None Anesthesia:    Anesthesia method:  None Procedure type:    Complexity:  Simple Procedure details:    Ultrasound guidance: no     Needle aspiration: yes     Needle size:  18 G   Incision depth:  Subcutaneous   Drainage:  Bloody and purulent   Drainage amount:  Scant   Wound treatment:  Wound left open   Packing materials:  None Post-procedure  details:    Procedure completion:  Tolerated well, no immediate complications    Medications - No data to display   ____________________________________________   INITIAL IMPRESSION / ASSESSMENT AND PLAN / ED COURSE  Pertinent labs & imaging results that were available during my care of the patient were reviewed by me and considered in my medical decision making (see chart for details).  Review of the  [place]RS was performed in accordance of the [facility] prior to dispensing any controlled drugs.           Patient's diagnosis is consistent with paronychia.  Patient presents emergency department with worsening pain to the right thumb.  Patient been diagnosed with what appears to be a worsening paronychia.  Evidently the area did not have enough fluid to open 2 days ago and patient been on Keflex.  Worsening pain and edema.  There appears to be a small area of fluctuance next to the nailbed which  was opened using an 18-gauge needle.  A small amount of purulence was drained.  Patiently transition from Keflex to Bactrim.  Return cautions discussed with patient.  No evidence for imaging or labs currently..  Follow-up primary care as needed.  Patient is given ED precautions to return to the ED for any worsening or new symptoms.     ____________________________________________  FINAL CLINICAL IMPRESSION(S) / ED DIAGNOSES  Final diagnoses:  Paronychia of finger of right hand      NEW MEDICATIONS STARTED DURING THIS VISIT:  ED Discharge Orders          Ordered    sulfamethoxazole-trimethoprim (BACTRIM DS) 800-160 MG tablet  2 times daily        02/28/21 1800                This chart was dictated using voice recognition software/Dragon. Despite best efforts to proofread, errors can occur which can change the meaning. Any change was purely unintentional.    Racheal Patches, PA-C 02/28/21 1803    Concha Se, MD 03/01/21 1125

## 2021-03-20 ENCOUNTER — Ambulatory Visit: Payer: Medicaid Other

## 2021-03-21 ENCOUNTER — Other Ambulatory Visit: Payer: Self-pay | Admitting: *Deleted

## 2021-03-21 MED ORDER — MEDROXYPROGESTERONE ACETATE 150 MG/ML IM SUSY: 150.0000 mg | PREFILLED_SYRINGE | INTRAMUSCULAR | 2 refills | Status: DC

## 2021-03-23 ENCOUNTER — Ambulatory Visit (INDEPENDENT_AMBULATORY_CARE_PROVIDER_SITE_OTHER): Payer: Medicaid Other | Admitting: *Deleted

## 2021-03-23 ENCOUNTER — Other Ambulatory Visit: Payer: Self-pay

## 2021-03-23 VITALS — BP 111/76 | HR 76

## 2021-03-23 DIAGNOSIS — Z3042 Encounter for surveillance of injectable contraceptive: Secondary | ICD-10-CM

## 2021-03-23 MED ORDER — MEDROXYPROGESTERONE ACETATE 150 MG/ML IM SUSP
150.0000 mg | Freq: Once | INTRAMUSCULAR | Status: AC
  Administered 2021-03-23: 150 mg via INTRAMUSCULAR

## 2021-03-23 NOTE — Progress Notes (Signed)
Subjective:  Pt in for Depo Provera injection.    Objective: Need for contraception. No unusual complaints.    Assessment: Depo given L upper outer quadrant. Pt tolerated injection well.   Plan:  Next injection due Dec 1-15, pt agrees.    Administrations This Visit     medroxyPROGESTERone (DEPO-PROVERA) injection 150 mg     Admin Date 03/23/2021 Action Given Dose 150 mg Route Intramuscular Administered By Lewayne Bunting, CMA

## 2021-03-25 NOTE — Progress Notes (Signed)
Patient was assessed and managed by nursing staff during this encounter. I have reviewed the chart and agree with the documentation and plan. I have also made any necessary editorial changes.  [place] Bing, MD 03/25/2021 1:10 AM

## 2021-04-13 ENCOUNTER — Telehealth: Payer: Self-pay | Admitting: Radiology

## 2021-04-13 NOTE — Telephone Encounter (Signed)
Left message to call [facility] to reschedule annual exam scheduled for 04/19/21 with Mary Underwood. Need to change to a different day or can just wait til depo injection date of 06/19/21 @ 11:00.

## 2021-04-19 ENCOUNTER — Ambulatory Visit: Payer: Medicaid Other | Admitting: Advanced Practice Midwife

## 2021-06-19 ENCOUNTER — Ambulatory Visit: Payer: Medicaid Other

## 2021-06-19 ENCOUNTER — Ambulatory Visit: Payer: Medicaid Other | Admitting: Obstetrics and Gynecology

## 2021-06-26 ENCOUNTER — Other Ambulatory Visit: Payer: Self-pay

## 2021-06-26 ENCOUNTER — Telehealth: Payer: Self-pay | Admitting: Radiology

## 2021-06-26 ENCOUNTER — Ambulatory Visit (INDEPENDENT_AMBULATORY_CARE_PROVIDER_SITE_OTHER): Payer: Medicaid Other

## 2021-06-26 VITALS — BP 129/72 | HR 82

## 2021-06-26 DIAGNOSIS — Z3042 Encounter for surveillance of injectable contraceptive: Secondary | ICD-10-CM

## 2021-06-26 LAB — POCT URINE PREGNANCY: Preg Test, Ur: NEGATIVE

## 2021-06-26 MED ORDER — MEDROXYPROGESTERONE ACETATE 150 MG/ML IM SUSP
150.0000 mg | Freq: Once | INTRAMUSCULAR | Status: AC
  Administered 2021-06-26: 16:00:00 150 mg via INTRAMUSCULAR

## 2021-06-26 NOTE — Progress Notes (Signed)
Date last pap: N/A. Last Depo-Provera: 03/23/21. Side Effects if any: None . Serum HCG indicated? N/A. Depo-Provera 150 mg IM given by: Corky Crafts Cma . Next appointment due 09/11/21-09/25/21.   *Pt had to have UPT today missed depo window by 4 days and had unprotected intercourse within last 2 wks.   UPT Negative

## 2021-06-26 NOTE — Telephone Encounter (Signed)
Left message for patient to call [facility] to scheduled Depo injection

## 2021-07-11 ENCOUNTER — Ambulatory Visit: Payer: Medicaid Other | Admitting: Obstetrics and Gynecology

## 2021-08-10 ENCOUNTER — Ambulatory Visit: Payer: Medicaid Other | Admitting: Obstetrics and Gynecology

## 2021-09-07 ENCOUNTER — Other Ambulatory Visit (HOSPITAL_COMMUNITY)
Admission: RE | Admit: 2021-09-07 | Discharge: 2021-09-07 | Disposition: A | Discharge status: Home / Self Care | Payer: Medicaid Other | Source: Ambulatory Visit | Attending: Obstetrics and Gynecology | Admitting: Obstetrics and Gynecology

## 2021-09-07 ENCOUNTER — Ambulatory Visit (INDEPENDENT_AMBULATORY_CARE_PROVIDER_SITE_OTHER): Payer: Medicaid Other | Admitting: Obstetrics and Gynecology

## 2021-09-07 ENCOUNTER — Other Ambulatory Visit: Payer: Self-pay

## 2021-09-07 VITALS — BP 123/81 | HR 82 | Wt 199.0 lb

## 2021-09-07 DIAGNOSIS — Z3042 Encounter for surveillance of injectable contraceptive: Secondary | ICD-10-CM

## 2021-09-07 DIAGNOSIS — Z113 Encounter for screening for infections with a predominantly sexual mode of transmission: Secondary | ICD-10-CM

## 2021-09-07 DIAGNOSIS — Z124 Encounter for screening for malignant neoplasm of cervix: Secondary | ICD-10-CM | POA: Diagnosis not present

## 2021-09-07 DIAGNOSIS — Z01419 Encounter for gynecological examination (general) (routine) without abnormal findings: Secondary | ICD-10-CM

## 2021-09-07 MED ORDER — MEDROXYPROGESTERONE ACETATE 150 MG/ML IM SUSP
150.0000 mg | Freq: Once | INTRAMUSCULAR | Status: AC
  Administered 2021-09-07: 150 mg via INTRAMUSCULAR

## 2021-09-07 NOTE — Progress Notes (Signed)
?Obstetrics and Gynecology ?Annual Patient Evaluation ? ?Appointment Date: 09/07/2021 ? ?OBGYN Clinic: [facility] ? ?Primary Care Provider: [facility] ?Chief Complaint:  ?Chief Complaint  ?Patient presents with  ? Gynecologic Exam  ? ? ?History of Present Illness: Mary Underwood is a 26 y.o. [id number] (No LMP recorded. Patient has had an injection.), seen for the above chief complaint.  ? ?Only complaint is right side "kicking" sometimes when she lays on the opposite side ? ?Review of Systems: A comprehensive review of systems was negative.  ? ?As Per HPI otherwise negative ? ?Past Medical History:  ?Past Medical History:  ?Diagnosis Date  ? Medical history non-contributory   ? ? ?Past Surgical History:  ?Past Surgical History:  ?Procedure Laterality Date  ? NO PAST SURGERIES    ? ? ?Past Obstetrical History:  ?OB History  ?Gravida Para Term Preterm AB Living  ?6 4 3 1 2 4   ?SAB IAB Ectopic Multiple Live Births  ?  2   0 4  ?  ?# Outcome Date GA Lbr Len/2nd Weight Sex Delivery Anes PTL Lv  ?6 IAB 04/12/20          ?5 Preterm 11/17/18 [redacted]w[redacted]d 03:30 / 00:05 4 lb 12.5 oz (2.17 kg) M Vag-Spont None  LIV  ?4 Term 11/03/17    F Vag-Spont   LIV  ?3 Term 02/26/16    F Vag-Spont   LIV  ?2 Term 10/14/13    F Vag-Spont   LIV  ?1 IAB           ? ? ?Past Gynecological History: As per HPI. ?Periods: rare ?History of Pap Smear(s): No.  ?She is currently using Depo-Provera for contraception.  ? ?Social History:  ?Social History  ? ?Socioeconomic History  ? Marital status: Single  ?  Spouse name: Not on file  ? Number of children: Not on file  ? Years of education: Not on file  ? Highest education level: Not on file  ?Occupational History  ? Not on file  ?Tobacco Use  ? Smoking status: Some Days  ? Smokeless tobacco: Never  ?Vaping Use  ? Vaping Use: Never used  ?Substance and Sexual Activity  ? Alcohol use: Not Currently  ? Drug use: Not Currently  ? Sexual activity: Yes  ?  Birth  control/protection: None  ?Other Topics Concern  ? Not on file  ?Social History Narrative  ? Not on file  ? ?Social Determinants of Health  ? ?Financial Resource Strain: Not on file  ?Food Insecurity: Not on file  ?Transportation Needs: Not on file  ?Physical Activity: Not on file  ?Stress: Not on file  ?Social Connections: Not on file  ?Intimate Partner Violence: Not on file  ? ? ?Family History: No family history on file. ?Medications ?Gillie Manners had no medications administered during this visit. ?Current Outpatient Medications  ?Medication Sig Dispense Refill  ? medroxyPROGESTERone Acetate 150 MG/ML SUSY Inject 1 mL (150 mg total) into the muscle every 3 (three) months. 0.9 mL 2  ? ?No current facility-administered medications for this visit.  ? ? ?Allergies ?Patient has no known allergies. ? ? ?Physical Exam:  ?BP 123/81   Pulse 82   Wt 199 lb (90.3 kg)   BMI 32.12 kg/m?  Body mass index is 32.12 kg/m?. ? ?General appearance: Well nourished, well developed female in no acute distress.  ?Neck:  Supple, normal appearance, and no thyromegaly  ?Cardiovascular: normal s1 and s2.  No murmurs, rubs  or gallops. ?Respiratory:  Clear to auscultation bilateral. Normal respiratory effort ?Abdomen: positive bowel sounds and no masses, hernias; diffusely non tender to palpation, non distended ?Breasts: breasts appear normal, no suspicious masses, no skin or nipple changes or axillary nodes, and normal palpation. ?Neuro/Psych:  Normal mood and affect.  ?Skin:  Warm and dry.  ?Lymphatic:  No inguinal lymphadenopathy.  ? ?Pelvic exam: is not limited by body habitus ?EGBUS: within normal limits ?Vagina: within normal limits and with no blood or discharge in the vault ?Cervix: normal appearing cervix without tenderness, discharge or lesions. ?Uterus:  nonenlarged and non tender ?Adnexa:  normal adnexa and no mass, fullness, tenderness ?Rectovaginal: deferred ? ?Laboratory: UPT negative ? ?Radiology: none ? ?Assessment: pt  doing well ? ?Plan:  ?1. Encounter for management and injection of depo-Provera ?Given today ? ?2. Well woman exam with routine gynecological exam ?Pt declines bloodwork. ?muscle spasm/twitch for her right side "kicking". Pt to follow s/s more closely. UPT net ?- Cytology - PAP( [place]) ? ?3. Screening examination for STD (sexually transmitted disease) ?- Cytology - PAP( [place]) ? ?4. Cervical cancer screening ?- Cytology - PAP( [place]) ? ?No orders of the defined types were placed in this encounter. ? ? ?RTC 67m for depo provera ? ?Durene Romans MD ?Attending ?[facility]) ? ?

## 2021-09-12 LAB — CYTOLOGY - PAP
Chlamydia: NEGATIVE
Comment: NEGATIVE
Comment: NEGATIVE
Comment: NEGATIVE
Comment: NORMAL
Diagnosis: NEGATIVE
High risk HPV: NEGATIVE
Neisseria Gonorrhea: NEGATIVE
Trichomonas: NEGATIVE

## 2021-09-12 MED ORDER — FLUCONAZOLE 150 MG PO TABS: 150.0000 mg | ORAL_TABLET | Freq: Once | ORAL | 0 refills | Status: AC

## 2021-09-12 NOTE — Addendum Note (Signed)
Addended by: Deseree Zemaitis Bing on: 09/12/2021 09:03 AM ? ? Modules accepted: Orders ? ?

## 2021-09-25 ENCOUNTER — Ambulatory Visit: Payer: Medicaid Other

## 2021-11-23 ENCOUNTER — Ambulatory Visit: Payer: Medicaid Other

## 2021-11-26 ENCOUNTER — Emergency Department: Payer: Medicaid Other

## 2021-11-26 ENCOUNTER — Other Ambulatory Visit: Payer: Self-pay

## 2021-11-26 ENCOUNTER — Emergency Department
Admission: EM | Admit: 2021-11-26 | Discharge: 2021-11-26 | Disposition: A | Discharge status: Other Acute Inpatient Hospital | Payer: Medicaid Other | Attending: Emergency Medicine | Admitting: Emergency Medicine

## 2021-11-26 DIAGNOSIS — S80812A Abrasion, left lower leg, initial encounter: Secondary | ICD-10-CM | POA: Insufficient documentation

## 2021-11-26 DIAGNOSIS — M549 Dorsalgia, unspecified: Secondary | ICD-10-CM | POA: Diagnosis not present

## 2021-11-26 DIAGNOSIS — S60512A Abrasion of left hand, initial encounter: Secondary | ICD-10-CM | POA: Diagnosis not present

## 2021-11-26 DIAGNOSIS — S199XXA Unspecified injury of neck, initial encounter: Secondary | ICD-10-CM | POA: Diagnosis not present

## 2021-11-26 DIAGNOSIS — Y92410 Unspecified street and highway as the place of occurrence of the external cause: Secondary | ICD-10-CM | POA: Insufficient documentation

## 2021-11-26 DIAGNOSIS — S0990XA Unspecified injury of head, initial encounter: Secondary | ICD-10-CM | POA: Diagnosis not present

## 2021-11-26 DIAGNOSIS — S61412A Laceration without foreign body of left hand, initial encounter: Secondary | ICD-10-CM | POA: Diagnosis not present

## 2021-11-26 DIAGNOSIS — R1013 Epigastric pain: Secondary | ICD-10-CM | POA: Diagnosis not present

## 2021-11-26 DIAGNOSIS — M25562 Pain in left knee: Secondary | ICD-10-CM | POA: Diagnosis not present

## 2021-11-26 DIAGNOSIS — S0993XA Unspecified injury of face, initial encounter: Secondary | ICD-10-CM | POA: Diagnosis not present

## 2021-11-26 DIAGNOSIS — M4312 Spondylolisthesis, cervical region: Secondary | ICD-10-CM | POA: Diagnosis not present

## 2021-11-26 DIAGNOSIS — Z043 Encounter for examination and observation following other accident: Secondary | ICD-10-CM | POA: Diagnosis not present

## 2021-11-26 DIAGNOSIS — S61012A Laceration without foreign body of left thumb without damage to nail, initial encounter: Secondary | ICD-10-CM | POA: Diagnosis not present

## 2021-11-26 DIAGNOSIS — F1290 Cannabis use, unspecified, uncomplicated: Secondary | ICD-10-CM | POA: Diagnosis not present

## 2021-11-26 DIAGNOSIS — S80811A Abrasion, right lower leg, initial encounter: Secondary | ICD-10-CM | POA: Diagnosis not present

## 2021-11-26 DIAGNOSIS — S36113A Laceration of liver, unspecified degree, initial encounter: Secondary | ICD-10-CM | POA: Diagnosis not present

## 2021-11-26 DIAGNOSIS — D72829 Elevated white blood cell count, unspecified: Secondary | ICD-10-CM | POA: Diagnosis not present

## 2021-11-26 DIAGNOSIS — M545 Low back pain, unspecified: Secondary | ICD-10-CM | POA: Insufficient documentation

## 2021-11-26 DIAGNOSIS — R58 Hemorrhage, not elsewhere classified: Secondary | ICD-10-CM | POA: Diagnosis not present

## 2021-11-26 DIAGNOSIS — S299XXA Unspecified injury of thorax, initial encounter: Secondary | ICD-10-CM | POA: Diagnosis not present

## 2021-11-26 DIAGNOSIS — Z23 Encounter for immunization: Secondary | ICD-10-CM | POA: Insufficient documentation

## 2021-11-26 DIAGNOSIS — M542 Cervicalgia: Secondary | ICD-10-CM | POA: Diagnosis not present

## 2021-11-26 DIAGNOSIS — R109 Unspecified abdominal pain: Secondary | ICD-10-CM | POA: Diagnosis not present

## 2021-11-26 DIAGNOSIS — S8992XA Unspecified injury of left lower leg, initial encounter: Secondary | ICD-10-CM | POA: Diagnosis present

## 2021-11-26 DIAGNOSIS — R079 Chest pain, unspecified: Secondary | ICD-10-CM | POA: Diagnosis not present

## 2021-11-26 DIAGNOSIS — S3993XA Unspecified injury of pelvis, initial encounter: Secondary | ICD-10-CM | POA: Diagnosis not present

## 2021-11-26 DIAGNOSIS — S36114A Minor laceration of liver, initial encounter: Secondary | ICD-10-CM | POA: Diagnosis not present

## 2021-11-26 DIAGNOSIS — Z041 Encounter for examination and observation following transport accident: Secondary | ICD-10-CM | POA: Diagnosis not present

## 2021-11-26 DIAGNOSIS — R0789 Other chest pain: Secondary | ICD-10-CM | POA: Diagnosis not present

## 2021-11-26 LAB — COMPREHENSIVE METABOLIC PANEL
ALT: 172 U/L — ABNORMAL HIGH (ref 0–44)
AST: 170 U/L — ABNORMAL HIGH (ref 15–41)
Albumin: 4.4 g/dL (ref 3.5–5.0)
Alkaline Phosphatase: 49 U/L (ref 38–126)
Anion gap: 10 (ref 5–15)
BUN: 9 mg/dL (ref 6–20)
CO2: 19 mmol/L — ABNORMAL LOW (ref 22–32)
Calcium: 9.4 mg/dL (ref 8.9–10.3)
Chloride: 111 mmol/L (ref 98–111)
Creatinine, Ser: 0.83 mg/dL (ref 0.44–1.00)
GFR, Estimated: >60 mL/min (ref >60)
Glucose, Bld: 146 mg/dL — ABNORMAL HIGH (ref 70–99)
Potassium: 3.3 mmol/L — ABNORMAL LOW (ref 3.5–5.1)
Sodium: 140 mmol/L (ref 135–145)
Total Bilirubin: 0.8 mg/dL (ref 0.3–1.2)
Total Protein: 8.1 g/dL (ref 6.5–8.1)

## 2021-11-26 LAB — URINALYSIS, ROUTINE W REFLEX MICROSCOPIC
Bacteria, UA: NONE SEEN
Bilirubin Urine: NEGATIVE
Glucose, UA: NEGATIVE mg/dL
Ketones, ur: NEGATIVE mg/dL
Leukocytes,Ua: NEGATIVE
Nitrite: NEGATIVE
Protein, ur: 30 mg/dL — AB
Specific Gravity, Urine: 1.042 — ABNORMAL HIGH (ref 1.005–1.030)
pH: 6 (ref 5.0–8.0)

## 2021-11-26 LAB — CBC WITH DIFFERENTIAL/PLATELET
Abs Immature Granulocytes: 0.15 10*3/uL — ABNORMAL HIGH (ref 0.00–0.07)
Basophils Absolute: 0 10*3/uL (ref 0.0–0.1)
Basophils Relative: 0 %
Eosinophils Absolute: 0 10*3/uL (ref 0.0–0.5)
Eosinophils Relative: 0 %
HCT: 38.9 % (ref 36.0–46.0)
Hemoglobin: 14.1 g/dL (ref 12.0–15.0)
Immature Granulocytes: 1 %
Lymphocytes Relative: 40 %
Lymphs Abs: 5.4 10*3/uL — ABNORMAL HIGH (ref 0.7–4.0)
MCH: 28.7 pg (ref 26.0–34.0)
MCHC: 36.2 g/dL — ABNORMAL HIGH (ref 30.0–36.0)
MCV: 79.1 fL — ABNORMAL LOW (ref 80.0–100.0)
Monocytes Absolute: 0.5 10*3/uL (ref 0.1–1.0)
Monocytes Relative: 4 %
Neutro Abs: 7.4 10*3/uL (ref 1.7–7.7)
Neutrophils Relative %: 55 %
Platelets: 413 10*3/uL — ABNORMAL HIGH (ref 150–400)
RBC: 4.92 MIL/uL (ref 3.87–5.11)
RDW: 12.9 % (ref 11.5–15.5)
Smear Review: NORMAL
WBC: 13.5 10*3/uL — ABNORMAL HIGH (ref 4.0–10.5)
nRBC: 0 % (ref 0.0–0.2)

## 2021-11-26 LAB — TYPE AND SCREEN
ABO/RH(D): O NEG
Antibody Screen: NEGATIVE

## 2021-11-26 LAB — LIPASE, BLOOD: Lipase: 35 U/L (ref 11–51)

## 2021-11-26 LAB — POC URINE PREG, ED: Preg Test, Ur: NEGATIVE

## 2021-11-26 MED ORDER — MORPHINE SULFATE (PF) 4 MG/ML IV SOLN
4.0000 mg | Freq: Once | INTRAVENOUS | Status: AC
  Administered 2021-11-26: 4 mg via INTRAVENOUS
  Filled 2021-11-26: qty 1

## 2021-11-26 MED ORDER — SODIUM CHLORIDE 0.9 % IV BOLUS
500.0000 mL | Freq: Once | INTRAVENOUS | Status: AC
  Administered 2021-11-26: 500 mL via INTRAVENOUS

## 2021-11-26 MED ORDER — IOHEXOL 300 MG/ML  SOLN
80.0000 mL | Freq: Once | INTRAMUSCULAR | Status: AC | PRN
Start: 2021-11-26 — End: 2021-11-26
  Administered 2021-11-26: 80 mL via INTRAVENOUS

## 2021-11-26 MED ORDER — TETANUS-DIPHTH-ACELL PERTUSSIS 5-2.5-18.5 LF-MCG/0.5 IM SUSY
0.5000 mL | PREFILLED_SYRINGE | Freq: Once | INTRAMUSCULAR | Status: AC
  Administered 2021-11-26: 0.5 mL via INTRAMUSCULAR
  Filled 2021-11-26: qty 0.5

## 2021-11-26 MED ORDER — ONDANSETRON HCL 4 MG/2ML IJ SOLN
4.0000 mg | Freq: Once | INTRAMUSCULAR | Status: AC
  Administered 2021-11-26: 4 mg via INTRAVENOUS
  Filled 2021-11-26: qty 2

## 2021-11-26 MED ORDER — FENTANYL CITRATE PF 50 MCG/ML IJ SOSY
50.0000 ug | PREFILLED_SYRINGE | INTRAMUSCULAR | Status: DC | PRN
  Administered 2021-11-26: 50 ug via INTRAVENOUS
  Filled 2021-11-26: qty 1
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
## 2021-11-26 NOTE — ED Notes (Signed)
Pt transferred to [facility] by [facility], Report given in person to [place] as well as on the phone to [place] from [employer]. Pt stable at this time. Belongings sent with pt (clothes, purse, and jewelry), 2 phones sent with sister per pt's wishes.
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
## 2021-11-26 NOTE — ED Notes (Signed)
Patient transported to CT 
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
## 2021-11-26 NOTE — ED Notes (Signed)
Paper consent to transfer signed by pt and placed in chart.

## 2021-11-27 DIAGNOSIS — S36115A Moderate laceration of liver, initial encounter: Secondary | ICD-10-CM | POA: Diagnosis not present

## 2021-11-27 DIAGNOSIS — M25562 Pain in left knee: Secondary | ICD-10-CM | POA: Diagnosis not present

## 2021-11-27 DIAGNOSIS — M79652 Pain in left thigh: Secondary | ICD-10-CM | POA: Diagnosis not present

## 2021-11-27 DIAGNOSIS — M25552 Pain in left hip: Secondary | ICD-10-CM | POA: Diagnosis not present

## 2021-11-28 DIAGNOSIS — S36114A Minor laceration of liver, initial encounter: Secondary | ICD-10-CM | POA: Diagnosis not present

## 2022-10-04 ENCOUNTER — Ambulatory Visit: Payer: Medicaid Other | Admitting: Obstetrics and Gynecology

## 2022-10-18 ENCOUNTER — Ambulatory Visit (INDEPENDENT_AMBULATORY_CARE_PROVIDER_SITE_OTHER): Payer: Medicaid Other | Admitting: Obstetrics and Gynecology

## 2022-10-18 ENCOUNTER — Encounter: Payer: Self-pay | Admitting: Obstetrics and Gynecology

## 2022-10-18 VITALS — BP 110/75 | HR 83 | Wt 178.0 lb

## 2022-10-18 DIAGNOSIS — Z309 Encounter for contraceptive management, unspecified: Secondary | ICD-10-CM | POA: Diagnosis not present

## 2022-10-18 MED ORDER — NORETHIN ACE-ETH ESTRAD-FE 1-20 MG-MCG(24) PO TABS: 1.0000 | ORAL_TABLET | Freq: Every day | ORAL | 3 refills | Status: DC
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
## 2022-10-18 NOTE — Progress Notes (Signed)
Last unprotected sex was 48 hours ago.   Would like to get back on depo

## 2022-12-18 DIAGNOSIS — H52223 Regular astigmatism, bilateral: Secondary | ICD-10-CM | POA: Diagnosis not present

## 2022-12-19 DIAGNOSIS — H5213 Myopia, bilateral: Secondary | ICD-10-CM | POA: Diagnosis not present

## 2023-01-08 ENCOUNTER — Emergency Department
Admission: EM | Admit: 2023-01-08 | Discharge: 2023-01-08 | Disposition: A | Discharge status: Home / Self Care | Payer: Medicaid Other | Attending: Emergency Medicine | Admitting: Emergency Medicine

## 2023-01-08 ENCOUNTER — Other Ambulatory Visit: Payer: Self-pay

## 2023-01-08 ENCOUNTER — Emergency Department: Payer: Medicaid Other

## 2023-01-08 DIAGNOSIS — L03012 Cellulitis of left finger: Secondary | ICD-10-CM | POA: Insufficient documentation

## 2023-01-08 DIAGNOSIS — M79645 Pain in left finger(s): Secondary | ICD-10-CM | POA: Diagnosis not present

## 2023-01-08 DIAGNOSIS — L03019 Cellulitis of unspecified finger: Secondary | ICD-10-CM

## 2023-01-08 DIAGNOSIS — M7989 Other specified soft tissue disorders: Secondary | ICD-10-CM | POA: Diagnosis not present

## 2023-01-08 MED ORDER — HYDROCODONE-ACETAMINOPHEN 5-325 MG PO TABS
1.0000 | ORAL_TABLET | Freq: Once | ORAL | Status: AC
  Administered 2023-01-08: 1 via ORAL
  Filled 2023-01-08: qty 1

## 2023-01-08 MED ORDER — SULFAMETHOXAZOLE-TRIMETHOPRIM 800-160 MG PO TABS
1.0000 | ORAL_TABLET | Freq: Once | ORAL | Status: AC
  Administered 2023-01-08: 1 via ORAL
  Filled 2023-01-08: qty 1

## 2023-01-08 MED ORDER — SULFAMETHOXAZOLE-TRIMETHOPRIM 800-160 MG PO TABS: 1.0000 | ORAL_TABLET | Freq: Two times a day (BID) | ORAL | 0 refills | Status: DC
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
## 2023-01-08 NOTE — ED Triage Notes (Signed)
Pt arrived via POV with reports of L index finger pain and swelling, pt states she thought she had a hangnail and did hair today and states the pain is worse.   Minimal redness noted on pad of finger

## 2023-01-24 ENCOUNTER — Ambulatory Visit: Payer: Medicaid Other | Admitting: Obstetrics and Gynecology

## 2023-01-29 DIAGNOSIS — H52223 Regular astigmatism, bilateral: Secondary | ICD-10-CM | POA: Diagnosis not present

## 2023-03-10 DIAGNOSIS — Z419 Encounter for procedure for purposes other than remedying health state, unspecified: Secondary | ICD-10-CM | POA: Diagnosis not present

## 2023-03-18 DIAGNOSIS — F411 Generalized anxiety disorder: Secondary | ICD-10-CM | POA: Diagnosis not present

## 2023-03-25 DIAGNOSIS — F4310 Post-traumatic stress disorder, unspecified: Secondary | ICD-10-CM | POA: Diagnosis not present

## 2023-03-26 ENCOUNTER — Other Ambulatory Visit: Payer: Self-pay

## 2023-03-26 DIAGNOSIS — R059 Cough, unspecified: Secondary | ICD-10-CM | POA: Diagnosis not present

## 2023-03-26 DIAGNOSIS — B9789 Other viral agents as the cause of diseases classified elsewhere: Secondary | ICD-10-CM | POA: Diagnosis not present

## 2023-03-26 DIAGNOSIS — J069 Acute upper respiratory infection, unspecified: Secondary | ICD-10-CM | POA: Insufficient documentation

## 2023-03-26 DIAGNOSIS — Z1152 Encounter for screening for COVID-19: Secondary | ICD-10-CM | POA: Insufficient documentation

## 2023-03-26 NOTE — ED Triage Notes (Signed)
Patient ambulatory to triage with steady gait, without difficulty or distress noted; pt reports for last 2 days having cough, congestion, bodyaches and HA

## 2023-03-27 ENCOUNTER — Emergency Department
Admission: EM | Admit: 2023-03-27 | Discharge: 2023-03-27 | Disposition: A | Discharge status: Home / Self Care | Payer: Medicaid Other | Attending: Emergency Medicine | Admitting: Emergency Medicine

## 2023-03-27 DIAGNOSIS — J069 Acute upper respiratory infection, unspecified: Secondary | ICD-10-CM

## 2023-03-27 MED ORDER — IBUPROFEN 800 MG PO TABS
800.0000 mg | ORAL_TABLET | Freq: Once | ORAL | Status: AC
  Administered 2023-03-27: 800 mg via ORAL
  Filled 2023-03-27: qty 1

## 2023-03-27 MED ORDER — OXYMETAZOLINE HCL 0.05 % NA SOLN
1.0000 | Freq: Once | NASAL | Status: AC
  Administered 2023-03-27: 1 via NASAL
  Filled 2023-03-27: qty 30

## 2023-03-27 MED ORDER — GUAIFENESIN 100 MG/5ML PO LIQD
5.0000 mL | Freq: Once | ORAL | Status: AC
  Administered 2023-03-27: 5 mL via ORAL
  Filled 2023-03-27: qty 10
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
## 2023-03-27 NOTE — Discharge Instructions (Addendum)
You may alternate Tylenol 1000 mg every 6 hours as needed for fever and pain and ibuprofen 800 mg every 6-8 hours as needed for fever and pain. Please rest and drink plenty of fluids. This is a viral illness causing your symptoms. You do not need antibiotics for a virus. You may use over-the-counter nasal saline spray and Afrin nasal saline spray as needed for nasal congestion. Please do not use Afrin for more than 3 days in a row. You may use guaifenesin and dextromethorphan as needed for cough.  You may use lozenges and Chloraseptic spray to help with sore throat.  Warm salt water gargles can also help with sore throat.  You may use over-the-counter Unisom (doxyalamine) or Benadryl (diphenhydramine) to help with sleep.  Please note that some combination medicines such as DayQuil and NyQuil have multiple medications in them.  Please make sure you look at all labels to ensure that you are not taking too much of any one particular medication.  Symptoms from a virus may take 7-14 days to run its course.  You may also use honey and salt water gargles to help with sore throat, cough, postnasal drainage.

## 2023-03-27 NOTE — ED Notes (Signed)
Patient verbalizes understanding of discharge instructions. Opportunity for questioning and answers were provided. Armband removed by staff, pt discharged from ED. Ambulated out to lobby  

## 2023-04-01 DIAGNOSIS — F411 Generalized anxiety disorder: Secondary | ICD-10-CM | POA: Diagnosis not present

## 2023-04-08 DIAGNOSIS — F411 Generalized anxiety disorder: Secondary | ICD-10-CM | POA: Diagnosis not present

## 2023-04-09 DIAGNOSIS — Z419 Encounter for procedure for purposes other than remedying health state, unspecified: Secondary | ICD-10-CM | POA: Diagnosis not present

## 2023-04-15 DIAGNOSIS — F411 Generalized anxiety disorder: Secondary | ICD-10-CM | POA: Diagnosis not present

## 2023-04-22 DIAGNOSIS — F411 Generalized anxiety disorder: Secondary | ICD-10-CM | POA: Diagnosis not present

## 2023-04-29 DIAGNOSIS — F411 Generalized anxiety disorder: Secondary | ICD-10-CM | POA: Diagnosis not present

## 2023-05-08 DIAGNOSIS — F411 Generalized anxiety disorder: Secondary | ICD-10-CM | POA: Diagnosis not present

## 2023-05-10 DIAGNOSIS — Z419 Encounter for procedure for purposes other than remedying health state, unspecified: Secondary | ICD-10-CM | POA: Diagnosis not present

## 2023-05-13 DIAGNOSIS — F411 Generalized anxiety disorder: Secondary | ICD-10-CM | POA: Diagnosis not present

## 2023-05-15 DIAGNOSIS — F411 Generalized anxiety disorder: Secondary | ICD-10-CM | POA: Diagnosis not present

## 2023-05-20 DIAGNOSIS — F411 Generalized anxiety disorder: Secondary | ICD-10-CM | POA: Diagnosis not present
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
## 2023-05-27 DIAGNOSIS — F411 Generalized anxiety disorder: Secondary | ICD-10-CM | POA: Diagnosis not present

## 2023-06-03 DIAGNOSIS — F411 Generalized anxiety disorder: Secondary | ICD-10-CM | POA: Diagnosis not present

## 2023-06-04 DIAGNOSIS — F411 Generalized anxiety disorder: Secondary | ICD-10-CM | POA: Diagnosis not present

## 2023-06-09 DIAGNOSIS — Z419 Encounter for procedure for purposes other than remedying health state, unspecified: Secondary | ICD-10-CM | POA: Diagnosis not present

## 2023-06-10 DIAGNOSIS — F411 Generalized anxiety disorder: Secondary | ICD-10-CM | POA: Diagnosis not present

## 2023-06-17 DIAGNOSIS — F411 Generalized anxiety disorder: Secondary | ICD-10-CM | POA: Diagnosis not present

## 2023-06-24 DIAGNOSIS — F411 Generalized anxiety disorder: Secondary | ICD-10-CM | POA: Diagnosis not present

## 2023-07-07 DIAGNOSIS — F411 Generalized anxiety disorder: Secondary | ICD-10-CM | POA: Diagnosis not present

## 2023-07-08 ENCOUNTER — Ambulatory Visit (INDEPENDENT_AMBULATORY_CARE_PROVIDER_SITE_OTHER): Payer: Medicaid Other

## 2023-07-08 ENCOUNTER — Other Ambulatory Visit (HOSPITAL_COMMUNITY)
Admission: RE | Admit: 2023-07-08 | Discharge: 2023-07-08 | Disposition: A | Discharge status: Home / Self Care | Payer: Medicaid Other | Source: Ambulatory Visit | Attending: Family Medicine | Admitting: Family Medicine

## 2023-07-08 DIAGNOSIS — N898 Other specified noninflammatory disorders of vagina: Secondary | ICD-10-CM | POA: Diagnosis not present

## 2023-07-08 DIAGNOSIS — Z113 Encounter for screening for infections with a predominantly sexual mode of transmission: Secondary | ICD-10-CM

## 2023-07-08 LAB — CERVICOVAGINAL ANCILLARY ONLY
Bacterial Vaginitis (gardnerella): POSITIVE — AB
Candida Glabrata: NEGATIVE
Candida Vaginitis: POSITIVE — AB
Chlamydia: NEGATIVE
Comment: NEGATIVE
Comment: NEGATIVE
Comment: NEGATIVE
Comment: NEGATIVE
Comment: NEGATIVE
Comment: NORMAL
Neisseria Gonorrhea: NEGATIVE
Trichomonas: NEGATIVE

## 2023-07-08 NOTE — Progress Notes (Signed)
SUBJECTIVE:  28 y.o. female who desires a STI screen. Notes abnormal vaginal discharge, no bleeding or significant pelvic pain. No UTI symptoms. Denies history of known exposure to STD.  No LMP recorded.  OBJECTIVE:  She appears well.   ASSESSMENT:  STI Screen   PLAN:  Pt offered STI blood screening-not indicated GC, chlamydia, trichomonas, BV and yeast probe sent to lab.  Treatment: To be determined once lab results are received.  Pt follow up as needed.

## 2023-07-09 MED ORDER — METRONIDAZOLE 500 MG PO TABS: 500.0000 mg | ORAL_TABLET | Freq: Two times a day (BID) | ORAL | 0 refills | Status: AC

## 2023-07-09 MED ORDER — FLUCONAZOLE 150 MG PO TABS: 150.0000 mg | ORAL_TABLET | Freq: Once | ORAL | 0 refills | Status: AC

## 2023-07-10 DIAGNOSIS — Z419 Encounter for procedure for purposes other than remedying health state, unspecified: Secondary | ICD-10-CM | POA: Diagnosis not present

## 2023-07-17 DIAGNOSIS — F411 Generalized anxiety disorder: Secondary | ICD-10-CM | POA: Diagnosis not present

## 2023-07-18 DIAGNOSIS — F411 Generalized anxiety disorder: Secondary | ICD-10-CM | POA: Diagnosis not present

## 2023-07-21 DIAGNOSIS — F411 Generalized anxiety disorder: Secondary | ICD-10-CM | POA: Diagnosis not present

## 2023-07-25 DIAGNOSIS — F411 Generalized anxiety disorder: Secondary | ICD-10-CM | POA: Diagnosis not present

## 2023-07-29 DIAGNOSIS — F411 Generalized anxiety disorder: Secondary | ICD-10-CM | POA: Diagnosis not present

## 2023-07-30 DIAGNOSIS — F411 Generalized anxiety disorder: Secondary | ICD-10-CM | POA: Diagnosis not present

## 2023-07-31 DIAGNOSIS — F411 Generalized anxiety disorder: Secondary | ICD-10-CM | POA: Diagnosis not present

## 2023-08-04 DIAGNOSIS — F411 Generalized anxiety disorder: Secondary | ICD-10-CM | POA: Diagnosis not present

## 2023-08-07 DIAGNOSIS — F411 Generalized anxiety disorder: Secondary | ICD-10-CM | POA: Diagnosis not present

## 2023-08-10 DIAGNOSIS — Z419 Encounter for procedure for purposes other than remedying health state, unspecified: Secondary | ICD-10-CM | POA: Diagnosis not present

## 2023-08-11 DIAGNOSIS — F411 Generalized anxiety disorder: Secondary | ICD-10-CM | POA: Diagnosis not present

## 2023-08-13 DIAGNOSIS — F411 Generalized anxiety disorder: Secondary | ICD-10-CM | POA: Diagnosis not present

## 2023-08-14 DIAGNOSIS — F411 Generalized anxiety disorder: Secondary | ICD-10-CM | POA: Diagnosis not present

## 2023-08-15 DIAGNOSIS — F411 Generalized anxiety disorder: Secondary | ICD-10-CM | POA: Diagnosis not present

## 2023-08-16 DIAGNOSIS — F411 Generalized anxiety disorder: Secondary | ICD-10-CM | POA: Diagnosis not present

## 2023-08-19 DIAGNOSIS — F411 Generalized anxiety disorder: Secondary | ICD-10-CM | POA: Diagnosis not present

## 2023-08-20 DIAGNOSIS — F411 Generalized anxiety disorder: Secondary | ICD-10-CM | POA: Diagnosis not present

## 2023-08-22 DIAGNOSIS — F411 Generalized anxiety disorder: Secondary | ICD-10-CM | POA: Diagnosis not present

## 2023-08-23 DIAGNOSIS — F411 Generalized anxiety disorder: Secondary | ICD-10-CM | POA: Diagnosis not present

## 2023-08-25 DIAGNOSIS — F411 Generalized anxiety disorder: Secondary | ICD-10-CM | POA: Diagnosis not present

## 2023-08-28 DIAGNOSIS — F411 Generalized anxiety disorder: Secondary | ICD-10-CM | POA: Diagnosis not present

## 2023-09-01 DIAGNOSIS — F411 Generalized anxiety disorder: Secondary | ICD-10-CM | POA: Diagnosis not present

## 2023-09-03 DIAGNOSIS — F411 Generalized anxiety disorder: Secondary | ICD-10-CM | POA: Diagnosis not present

## 2023-09-04 DIAGNOSIS — F411 Generalized anxiety disorder: Secondary | ICD-10-CM | POA: Diagnosis not present

## 2023-09-07 DIAGNOSIS — Z419 Encounter for procedure for purposes other than remedying health state, unspecified: Secondary | ICD-10-CM | POA: Diagnosis not present

## 2023-09-08 DIAGNOSIS — F411 Generalized anxiety disorder: Secondary | ICD-10-CM | POA: Diagnosis not present

## 2023-09-10 DIAGNOSIS — F411 Generalized anxiety disorder: Secondary | ICD-10-CM | POA: Diagnosis not present

## 2023-09-15 DIAGNOSIS — F411 Generalized anxiety disorder: Secondary | ICD-10-CM | POA: Diagnosis not present

## 2023-09-16 DIAGNOSIS — F411 Generalized anxiety disorder: Secondary | ICD-10-CM | POA: Diagnosis not present

## 2023-09-17 DIAGNOSIS — F411 Generalized anxiety disorder: Secondary | ICD-10-CM | POA: Diagnosis not present

## 2023-09-18 DIAGNOSIS — F411 Generalized anxiety disorder: Secondary | ICD-10-CM | POA: Diagnosis not present

## 2023-09-29 DIAGNOSIS — F411 Generalized anxiety disorder: Secondary | ICD-10-CM | POA: Diagnosis not present

## 2023-10-01 DIAGNOSIS — F411 Generalized anxiety disorder: Secondary | ICD-10-CM | POA: Diagnosis not present

## 2023-10-08 DIAGNOSIS — F411 Generalized anxiety disorder: Secondary | ICD-10-CM | POA: Diagnosis not present

## 2023-10-10 DIAGNOSIS — F411 Generalized anxiety disorder: Secondary | ICD-10-CM | POA: Diagnosis not present

## 2023-10-13 DIAGNOSIS — F411 Generalized anxiety disorder: Secondary | ICD-10-CM | POA: Diagnosis not present

## 2023-10-15 DIAGNOSIS — F411 Generalized anxiety disorder: Secondary | ICD-10-CM | POA: Diagnosis not present

## 2023-10-19 DIAGNOSIS — Z419 Encounter for procedure for purposes other than remedying health state, unspecified: Secondary | ICD-10-CM | POA: Diagnosis not present

## 2023-10-20 DIAGNOSIS — F411 Generalized anxiety disorder: Secondary | ICD-10-CM | POA: Diagnosis not present

## 2023-10-22 DIAGNOSIS — F411 Generalized anxiety disorder: Secondary | ICD-10-CM | POA: Diagnosis not present

## 2023-10-27 DIAGNOSIS — F411 Generalized anxiety disorder: Secondary | ICD-10-CM | POA: Diagnosis not present

## 2023-10-29 DIAGNOSIS — F411 Generalized anxiety disorder: Secondary | ICD-10-CM | POA: Diagnosis not present

## 2023-10-30 DIAGNOSIS — F411 Generalized anxiety disorder: Secondary | ICD-10-CM | POA: Diagnosis not present

## 2023-10-31 DIAGNOSIS — F411 Generalized anxiety disorder: Secondary | ICD-10-CM | POA: Diagnosis not present

## 2023-11-04 DIAGNOSIS — F411 Generalized anxiety disorder: Secondary | ICD-10-CM | POA: Diagnosis not present

## 2023-11-06 DIAGNOSIS — F411 Generalized anxiety disorder: Secondary | ICD-10-CM | POA: Diagnosis not present

## 2023-11-07 ENCOUNTER — Emergency Department
Admission: EM | Admit: 2023-11-07 | Discharge: 2023-11-07 | Disposition: A | Discharge status: Home / Self Care | Attending: Emergency Medicine | Admitting: Emergency Medicine

## 2023-11-07 ENCOUNTER — Other Ambulatory Visit: Payer: Self-pay

## 2023-11-07 ENCOUNTER — Emergency Department

## 2023-11-07 ENCOUNTER — Encounter: Payer: Self-pay | Admitting: Emergency Medicine

## 2023-11-07 DIAGNOSIS — O219 Vomiting of pregnancy, unspecified: Secondary | ICD-10-CM | POA: Diagnosis not present

## 2023-11-07 DIAGNOSIS — Z3A08 8 weeks gestation of pregnancy: Secondary | ICD-10-CM | POA: Diagnosis not present

## 2023-11-07 DIAGNOSIS — Z3A01 Less than 8 weeks gestation of pregnancy: Secondary | ICD-10-CM | POA: Diagnosis not present

## 2023-11-07 DIAGNOSIS — O208 Other hemorrhage in early pregnancy: Secondary | ICD-10-CM | POA: Diagnosis not present

## 2023-11-07 DIAGNOSIS — O26891 Other specified pregnancy related conditions, first trimester: Secondary | ICD-10-CM | POA: Diagnosis not present

## 2023-11-07 LAB — CBC
HCT: 32 % — ABNORMAL LOW (ref 36.0–46.0)
Hemoglobin: 11.9 g/dL — ABNORMAL LOW (ref 12.0–15.0)
MCH: 30.1 pg (ref 26.0–34.0)
MCHC: 37.2 g/dL — ABNORMAL HIGH (ref 30.0–36.0)
MCV: 80.8 fL (ref 80.0–100.0)
Platelets: 298 10*3/uL (ref 150–400)
RBC: 3.96 MIL/uL (ref 3.87–5.11)
RDW: 12.1 % (ref 11.5–15.5)
WBC: 6.8 10*3/uL (ref 4.0–10.5)
nRBC: 0 % (ref 0.0–0.2)

## 2023-11-07 LAB — URINALYSIS, ROUTINE W REFLEX MICROSCOPIC
Bilirubin Urine: NEGATIVE
Glucose, UA: NEGATIVE mg/dL
Hgb urine dipstick: NEGATIVE
Ketones, ur: 20 mg/dL — AB
Leukocytes,Ua: NEGATIVE
Nitrite: NEGATIVE
Protein, ur: NEGATIVE mg/dL
Specific Gravity, Urine: 1.024 (ref 1.005–1.030)
pH: 5 (ref 5.0–8.0)

## 2023-11-07 LAB — COMPREHENSIVE METABOLIC PANEL WITH GFR
ALT: 14 U/L (ref 0–44)
AST: 16 U/L (ref 15–41)
Albumin: 3.9 g/dL (ref 3.5–5.0)
Alkaline Phosphatase: 30 U/L — ABNORMAL LOW (ref 38–126)
Anion gap: 6 (ref 5–15)
BUN: 8 mg/dL (ref 6–20)
CO2: 19 mmol/L — ABNORMAL LOW (ref 22–32)
Calcium: 9 mg/dL (ref 8.9–10.3)
Chloride: 107 mmol/L (ref 98–111)
Creatinine, Ser: 0.57 mg/dL (ref 0.44–1.00)
GFR, Estimated: >60 mL/min (ref >60)
Glucose, Bld: 83 mg/dL (ref 70–99)
Potassium: 3.5 mmol/L (ref 3.5–5.1)
Sodium: 132 mmol/L — ABNORMAL LOW (ref 135–145)
Total Bilirubin: 0.7 mg/dL (ref 0.0–1.2)
Total Protein: 7.1 g/dL (ref 6.5–8.1)

## 2023-11-07 LAB — LIPASE, BLOOD: Lipase: 24 U/L (ref 11–51)

## 2023-11-07 LAB — ABO/RH: ABO/RH(D): O NEG

## 2023-11-07 LAB — HCG, QUANTITATIVE, PREGNANCY: hCG, Beta Chain, Quant, S: 73771 m[IU]/mL — ABNORMAL HIGH (ref <5)

## 2023-11-07 MED ORDER — SODIUM CHLORIDE 0.9 % IV BOLUS
1000.0000 mL | Freq: Once | INTRAVENOUS | Status: AC
  Administered 2023-11-07: 1000 mL via INTRAVENOUS

## 2023-11-07 MED ORDER — METOCLOPRAMIDE HCL 10 MG PO TABS
10.0000 mg | ORAL_TABLET | Freq: Once | ORAL | Status: AC
  Administered 2023-11-07: 10 mg via ORAL
  Filled 2023-11-07: qty 1

## 2023-11-07 MED ORDER — METOCLOPRAMIDE HCL 10 MG PO TABS: 10.0000 mg | ORAL_TABLET | Freq: Three times a day (TID) | ORAL | 0 refills | Status: DC | PRN

## 2023-11-07 MED ORDER — METOCLOPRAMIDE HCL 10 MG PO TABS: 10.0000 mg | ORAL_TABLET | Freq: Three times a day (TID) | ORAL | 0 refills | Status: AC | PRN

## 2023-11-07 MED ORDER — ONDANSETRON 4 MG PO TBDP
4.0000 mg | ORAL_TABLET | Freq: Once | ORAL | Status: AC
  Administered 2023-11-07: 4 mg via ORAL

## 2023-11-07 MED ORDER — ONDANSETRON 4 MG PO TBDP
ORAL_TABLET | ORAL | Status: AC
  Filled 2023-11-07: qty 1

## 2023-11-07 NOTE — Discharge Instructions (Addendum)
 Please take your nausea medication as needed but only as prescribed.  Please follow-up with your OB/GYN as scheduled.  Return to the emergency department for any abdominal pain, significant vomiting unable to keep down fluids, vaginal bleeding or fluid leakage or any other symptom personally concerning to yourself.

## 2023-11-07 NOTE — ED Provider Triage Note (Signed)
 Emergency Medicine Provider Triage Evaluation Note  Mary Underwood , a 28 y.o. female  was evaluated in triage.  Pt complains of nausea, vomiting, pregnancy.  Abdominal cramping, no bleeding.  Review of Systems  Positive:  Negative:   Physical Exam  BP 116/74 (BP Location: Left Arm)   Pulse 73   Temp 98.3 F (36.8 C)   Resp 16   Wt 68 kg   LMP 09/06/2023   SpO2 100%   BMI 23.48 kg/m  Gen:   Awake, no distress   Resp:  Normal effort  MSK:   Moves extremities without difficulty  Other:    Medical Decision Making  Medically screening exam initiated at 11:23 AM.  Appropriate orders placed.  Mary Underwood was informed that the remainder of the evaluation will be completed by another provider, this initial triage assessment does not replace that evaluation, and the importance of remaining in the ED until their evaluation is complete.  Labs, imaging ordered   Mary Figures, PA-C 11/07/23 1124

## 2023-11-07 NOTE — ED Triage Notes (Signed)
 C/O N/V and stomach cramping.  N/V for several weeks.  Patient is [redacted] weeks pregnant. Had [facility] ([facility] for nausea meds, but has not heard back from them.  G5 P4

## 2023-11-07 NOTE — ED Provider Notes (Signed)
 [facility] Provider Note    Event Date/Time   First MD Initiated Contact with Patient 11/07/23 1140     (approximate)  History   Chief Complaint: Nausea and vomiting  HPI  Mary Underwood is a 28 y.o. female approximately [redacted] weeks pregnant who presents to the emergency department for several weeks of nausea and vomiting.  According to the patient she is [redacted] weeks pregnant, for the last 2 to 3 weeks she has been nauseated most days vomiting occasionally but more so over the last few days.  Patient was concerned she could be dehydrated so she came to the emergency department.  Patient's first OB appointment is coming up next week, but they said they cannot prescribe her nausea medication until she is seen so they recommended she come to the emergency department.  Physical Exam   Triage Vital Signs: ED Triage Vitals  Encounter Vitals Group     BP 11/07/23 1119 116/74     Systolic BP Percentile --      Diastolic BP Percentile --      Pulse Rate 11/07/23 1119 73     Resp 11/07/23 1119 16     Temp 11/07/23 1119 98.3 F (36.8 C)     Temp src --      SpO2 11/07/23 1119 100 %     Weight 11/07/23 1118 149 lb 14.6 oz (68 kg)     Height --      Head Circumference --      Peak Flow --      Pain Score 11/07/23 1118 0     Pain Loc --      Pain Education --      Exclude from Growth Chart --     Most recent vital signs: Vitals:   11/07/23 1119  BP: 116/74  Pulse: 73  Resp: 16  Temp: 98.3 F (36.8 C)  SpO2: 100%    General: Awake, no distress.  CV:  Good peripheral perfusion.  Regular rate and rhythm  Resp:  Normal effort.  Equal breath sounds bilaterally.  Abd:  No distention.  Soft, nontender.  No rebound or guarding.  Benign abdomen.  ED Results / Procedures / Treatments   RADIOLOGY  Ultrasound shows single live IUP at 8 weeks and 6 days with a heart rate of 176.  Small subchorionic hemorrhage.   MEDICATIONS ORDERED IN ED: Medications   ondansetron  (ZOFRAN -ODT) 4 MG disintegrating tablet (has no administration in time range)  ondansetron  (ZOFRAN -ODT) disintegrating tablet 4 mg (4 mg Oral Given 11/07/23 1124)  sodium chloride  0.9 % bolus 1,000 mL (1,000 mLs Intravenous New Bag/Given 11/07/23 1238)  metoCLOPramide  (REGLAN ) tablet 10 mg (10 mg Oral Given 11/07/23 1238)     IMPRESSION / MDM / ASSESSMENT AND PLAN / ED COURSE  I reviewed the triage vital signs and the nursing notes.  Patient's presentation is most consistent with acute presentation with potential threat to life or bodily function.  Patient presents to the emergency department for nausea and vomiting.  Patient states she is also been experiencing cramping across the lower abdomen for the last few weeks as well.  Denies any vaginal bleeding or discharge.  No fluid leakage or urinary symptoms.  Patient is going to be seen at [facility] but has not yet had her first appointment.  Will check labs including a quantitative beta-hCG will obtain an ultrasound to evaluate the patient's pregnancy.  Will dose IV fluids as well as Reglan .  As long as the patient's workup does not show any significant abnormality I believe the patient could be discharged home with Reglan  as needed until she sees her OB next week.  Patient is agreeable to this plan of care as well.  Labs so far reassuring with a reassuring chemistry.  Urinalysis shows slight amount of ketones otherwise no concerning finding.  Remainder the lab work is pending.  Ultrasound shows 8-week 6-day intrauterine pregnancy.  Patient's CBC is normal, chemistry normal beta-hCG of 73,000.  We will discharge patient home with a prescription for Reglan  have the patient follow-up with her OB as scheduled this coming week.  Patient agreeable to plan.  FINAL CLINICAL IMPRESSION(S) / ED DIAGNOSES   Nausea vomiting of pregnancy   Note:  This document was prepared using Dragon voice recognition software and may include unintentional  dictation errors.   Ruth Cove, MD 11/07/23 [phone]

## 2023-11-12 ENCOUNTER — Encounter

## 2023-11-18 DIAGNOSIS — Z419 Encounter for procedure for purposes other than remedying health state, unspecified: Secondary | ICD-10-CM | POA: Diagnosis not present

## 2023-12-03 ENCOUNTER — Encounter: Admitting: Obstetrics & Gynecology

## 2023-12-04 NOTE — Progress Notes (Signed)
 This encounter was created in error - please disregard.

## 2023-12-08 DIAGNOSIS — F411 Generalized anxiety disorder: Secondary | ICD-10-CM | POA: Diagnosis not present

## 2023-12-18 ENCOUNTER — Encounter: Payer: Self-pay | Admitting: Certified Nurse Midwife

## 2023-12-18 ENCOUNTER — Ambulatory Visit (INDEPENDENT_AMBULATORY_CARE_PROVIDER_SITE_OTHER): Admitting: Certified Nurse Midwife

## 2023-12-18 VITALS — BP 108/72 | HR 61 | Wt 157.0 lb

## 2023-12-18 DIAGNOSIS — O0992 Supervision of high risk pregnancy, unspecified, second trimester: Secondary | ICD-10-CM

## 2023-12-18 DIAGNOSIS — Z8751 Personal history of pre-term labor: Secondary | ICD-10-CM

## 2023-12-18 DIAGNOSIS — Z1331 Encounter for screening for depression: Secondary | ICD-10-CM | POA: Diagnosis not present

## 2023-12-18 DIAGNOSIS — O0990 Supervision of high risk pregnancy, unspecified, unspecified trimester: Secondary | ICD-10-CM | POA: Insufficient documentation

## 2023-12-18 DIAGNOSIS — O09219 Supervision of pregnancy with history of pre-term labor, unspecified trimester: Secondary | ICD-10-CM | POA: Insufficient documentation

## 2023-12-18 DIAGNOSIS — Z3A14 14 weeks gestation of pregnancy: Secondary | ICD-10-CM

## 2023-12-18 DIAGNOSIS — Z3480 Encounter for supervision of other normal pregnancy, unspecified trimester: Secondary | ICD-10-CM | POA: Insufficient documentation

## 2023-12-18 DIAGNOSIS — Z641 Problems related to multiparity: Secondary | ICD-10-CM

## 2023-12-18 DIAGNOSIS — O09892 Supervision of other high risk pregnancies, second trimester: Secondary | ICD-10-CM

## 2023-12-18 DIAGNOSIS — R112 Nausea with vomiting, unspecified: Secondary | ICD-10-CM | POA: Diagnosis not present

## 2023-12-18 DIAGNOSIS — Z2913 Encounter for prophylactic Rho(D) immune globulin: Secondary | ICD-10-CM

## 2023-12-18 HISTORY — DX: Supervision of pregnancy with history of pre-term labor, unspecified trimester: O09.219

## 2023-12-18 NOTE — Progress Notes (Signed)
 INITIAL PRENATAL VISIT  History:  Mary Underwood is a 28 y.o. [id number] at [redacted]w[redacted]d by early ultrasound being seen today for her first obstetrical visit.  Her obstetrical history is significant for [employer], Preterm Birth at 32 weeks. Patient does intend to breast feed. Pregnancy history fully reviewed.  Patient reports no complaints.  HISTORY: OB History  Gravida Para Term Preterm AB Living  7 4 3 1 2 4   SAB IAB Ectopic Multiple Live Births  0 2 0 0 4    # Outcome Date GA Lbr Len/2nd Weight Sex Type Anes PTL Lv  7 Current           6 IAB 04/12/20          5 Preterm 11/17/18 [redacted]w[redacted]d 03:30 / 00:05 4 lb 12.5 oz (2.17 kg) M Vag-Spont None  LIV     Name: Roback,BOY Steele     Apgar1: 8  Apgar5: 9  4 Term 11/03/17    F Vag-Spont   LIV  3 Term 02/26/16    F Vag-Spont   LIV  2 Term 10/14/13    F Vag-Spont   LIV  1 IAB             Last pap smear was done 2023 and was normal  Past Medical History:  Diagnosis Date   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 No pertinent family history. Social History   Tobacco Use   Smoking status: Some Days   Smokeless tobacco: Never  Vaping Use   Vaping status: Never Used  Substance Use Topics   Alcohol use: Not Currently   Drug use: Yes    Types: Marijuana   Allergies  Allergen Reactions   Pollen Extract    Current Outpatient Medications on File Prior to Visit  Medication Sig Dispense Refill   metoCLOPramide  (REGLAN ) 10 MG tablet Take 1 tablet (10 mg total) by mouth every 8 (eight) hours as needed for nausea. 30 tablet 0   No current facility-administered medications on file prior to visit.    Review of Systems Pertinent items noted in HPI and remainder of comprehensive ROS otherwise negative.  Indications for ASA therapy (per UpToDate) One of the following: (Needs 162 mg daily) Previous pregnancy with  preeclampsia, especially early onset and with an adverse outcome No Chronic hypertension No Type 1 or 2 diabetes mellitus No Multifetal gestation No Chronic kidney disease No Autoimmune disease (antiphospholipid syndrome, systemic lupus erythematosus) No Two or more of the following: (Can do 81 mg daily) Nulliparity No Obesity (body mass index >30 kg/m2) No Family history of preeclampsia in mother or sister No Age >=35 years No Sociodemographic characteristics (African American race, low socioeconomic level) Yes Personal risk factors (eg, previous pregnancy with low birth weight or small for gestational age infant, previous adverse pregnancy outcome [eg, stillbirth], interval >10 years between pregnancies) No In vitro conception No  Physical Exam:   Vitals:   12/18/23 1338  BP: 108/72  Pulse: 61  Weight: 157 lb (71.2 kg)   Fetal Heart Rate (bpm): 150    General: well-developed, well-nourished female in no acute distress  Skin: normal coloration and turgor, no rashes  Neurologic: oriented, normal, negative, normal mood  Extremities: normal strength, tone, and muscle mass, ROM of all joints is normal  HEENT PERRLA, extraocular movement intact and sclera clear, anicteric  Neck supple  and no masses  Cardiovascular: regular rate and rhythm  Respiratory:  no respiratory distress, normal breath sounds  Abdomen: soft, non-tender; bowel sounds normal; no masses,  no organomegaly  Pelvic: Deferred per patient request.    Assessment:  Pregnancy: [id number] Patient Active Problem List   Diagnosis Date Noted   Supervision of other normal pregnancy, antepartum 12/18/2023    Plan:  1. Supervision of other normal pregnancy, antepartum (Primary) 3. Grand multipara - patient doing well  - plan for PAP in PP  - Denies fetal movement but reassured by fetal movement expectations for 2nd trimester.  - CBC/D/Plt+RPR+Rh+ABO+RubIgG... - Culture, OB Urine - Cervicovaginal ancillary only -  Hemoglobin A1c - US  MFM OB COMP + 14 WK; Future - PANORAMA PRENATAL TEST  4. [redacted] weeks gestation of pregnancy - Reviewed 2nd trimester expectations   5. Nausea and vomiting, unspecified vomiting type - Stable on Reglan    6. History of preterm delivery, currently pregnant in second trimester  7. Need for rhogam due to Rh negative mother - Rhogam at 28 weeks    Initial labs drawn. Continue prenatal vitamins. Problem list reviewed and updated. Genetic Screening discussed, Panorama and Horizon: requested. Ultrasound discussed; fetal anatomic survey: planned. Anticipatory guidance about prenatal visits given including labs, ultrasounds, and testing. Weight gain recommendations per IOM guidelines reviewed: underweight/BMI 18.5 or less > 28 - 40 lbs; normal weight/BMI 18.5 - 24.9 > 25 - 35 lbs; overweight/BMI 25 - 29.9 > 15 - 25 lbs; obese/BMI 30 or more > 11 - 20 lbs. Discussed usage of the Babyscripts app for more information about pregnancy, and to track blood pressures. Also discussed usage of virtual visits as additional source of managing and completing prenatal visits.  Patient was encouraged to use MyChart to review results, send requests, and have questions addressed.   The nature of [facility] for [facility]ulty [facility] with multiple MDs and Advanced Practice Providers was explained to patient; also emphasized that residents, students are part of our team. Routine obstetric precautions reviewed. Encouraged to seek out care at our office or emergency room [facility] MAU preferred) for urgent and/or emergent concerns. Return in about 4 weeks (around 01/15/2024) for HROB.     Nalla Purdy Maurie Southern) Marlys Singh, MSN, CNM  [facility]  12/18/2023 2:22 PM

## 2023-12-19 DIAGNOSIS — Z419 Encounter for procedure for purposes other than remedying health state, unspecified: Secondary | ICD-10-CM | POA: Diagnosis not present

## 2023-12-19 LAB — CBC/D/PLT+RPR+RH+ABO+RUBIGG...
Antibody Screen: NEGATIVE
Basophils Absolute: 0 10*3/uL (ref 0.0–0.2)
Basos: 1 %
EOS (ABSOLUTE): 0 10*3/uL (ref 0.0–0.4)
Eos: 1 %
HCV Ab: NONREACTIVE
HIV Screen 4th Generation wRfx: NONREACTIVE
Hematocrit: 37.6 % (ref 34.0–46.6)
Hemoglobin: 12.6 g/dL (ref 11.1–15.9)
Hepatitis B Surface Ag: NEGATIVE
Immature Grans (Abs): 0 10*3/uL (ref 0.0–0.1)
Immature Granulocytes: 0 %
Lymphocytes Absolute: 2.4 10*3/uL (ref 0.7–3.1)
Lymphs: 37 %
MCH: 30 pg (ref 26.6–33.0)
MCHC: 33.5 g/dL (ref 31.5–35.7)
MCV: 90 fL (ref 79–97)
Monocytes Absolute: 0.3 10*3/uL (ref 0.1–0.9)
Monocytes: 5 %
Neutrophils Absolute: 3.8 10*3/uL (ref 1.4–7.0)
Neutrophils: 56 %
Platelets: 299 10*3/uL (ref 150–450)
RBC: 4.2 x10E6/uL (ref 3.77–5.28)
RDW: 12.8 % (ref 11.7–15.4)
RPR Ser Ql: NONREACTIVE
Rh Factor: NEGATIVE
Rubella Antibodies, IGG: <0.9 {index} — ABNORMAL LOW (ref >0.99)
WBC: 6.6 10*3/uL (ref 3.4–10.8)

## 2023-12-19 LAB — HCV INTERPRETATION

## 2023-12-19 LAB — CERVICOVAGINAL ANCILLARY ONLY
Chlamydia: NEGATIVE
Comment: NEGATIVE
Comment: NORMAL
Neisseria Gonorrhea: NEGATIVE

## 2023-12-19 LAB — HEMOGLOBIN A1C
Est. average glucose Bld gHb Est-mCnc: 88 mg/dL
Hgb A1c MFr Bld: 4.7 % — ABNORMAL LOW (ref 4.8–5.6)

## 2023-12-22 DIAGNOSIS — F411 Generalized anxiety disorder: Secondary | ICD-10-CM | POA: Diagnosis not present

## 2023-12-24 LAB — PANORAMA PRENATAL TEST FULL PANEL:PANORAMA TEST PLUS 5 ADDITIONAL MICRODELETIONS: FETAL FRACTION: 5.2

## 2023-12-29 DIAGNOSIS — F411 Generalized anxiety disorder: Secondary | ICD-10-CM | POA: Diagnosis not present

## 2024-01-15 ENCOUNTER — Encounter: Payer: Self-pay | Admitting: Obstetrics & Gynecology

## 2024-01-15 ENCOUNTER — Ambulatory Visit (INDEPENDENT_AMBULATORY_CARE_PROVIDER_SITE_OTHER): Admitting: Obstetrics & Gynecology

## 2024-01-15 VITALS — BP 106/69 | HR 81 | Wt 160.0 lb

## 2024-01-15 DIAGNOSIS — O0992 Supervision of high risk pregnancy, unspecified, second trimester: Secondary | ICD-10-CM | POA: Diagnosis not present

## 2024-01-15 DIAGNOSIS — Z3A18 18 weeks gestation of pregnancy: Secondary | ICD-10-CM | POA: Diagnosis not present

## 2024-01-15 DIAGNOSIS — O09219 Supervision of pregnancy with history of pre-term labor, unspecified trimester: Secondary | ICD-10-CM

## 2024-01-15 NOTE — Progress Notes (Signed)
   PRENATAL VISIT NOTE  Subjective:  Mary Underwood is a 28 y.o. [id number] at [redacted]w[redacted]d being seen today for ongoing prenatal care.  She is currently monitored for the following issues for this high-risk pregnancy and has Supervision of high-risk pregnancy; Grand multipara; and Previous preterm delivery at 32 weeks, antepartum on their problem list.  Patient reports no complaints.  Contractions: Not present. Vag. Bleeding: None.  Movement: Present. Denies leaking of fluid.   The following portions of the patient's history were reviewed and updated as appropriate: allergies, current medications, past family history, past medical history, past social history, past surgical history and problem list.   Objective:    Vitals:   01/15/24 1329  BP: 106/69  Pulse: 81  Weight: 160 lb (72.6 kg)    Fetal Status:  Fetal Heart Rate (bpm): 140   Movement: Present    General: Alert, oriented and cooperative. Patient is in no acute distress.  Skin: Skin is warm and dry. No rash noted.   Cardiovascular: Normal heart rate noted  Respiratory: Normal respiratory effort, no problems with respiration noted  Abdomen: Soft, gravid, appropriate for gestational age.  Pain/Pressure: Present     Pelvic: Cervical exam deferred        Extremities: Normal range of motion.  Edema: None  Mental Status: Normal mood and affect. Normal behavior. Normal judgment and thought content.   Assessment and Plan:  Pregnancy: [id number] at [redacted]w[redacted]d 1. Previous preterm delivery at 32 weeks, antepartum (Primary) PTL precautions reviewed  2. [redacted] weeks gestation of pregnancy 3. Supervision of high risk pregnancy in second trimester Declined AFP screen.  Anatomy scan already scheduled. Preterm labor symptoms and general obstetric precautions including but not limited to vaginal bleeding, contractions, leaking of fluid and fetal movement were reviewed in detail with the patient. Please refer to After Visit Summary for other counseling  recommendations.   Return in about 4 weeks (around 02/12/2024) for OFFICE OB VISIT (MD or APP).  Future Appointments  Date Time Provider Department Center  01/31/2024  8:00 AM [facility] PROVIDER 1 [facility]  01/31/2024  8:30 AM [facility] US5 [facility]  02/12/2024  1:30 PM Izell Harari, MD [facility]    Gloris Hugger, MD

## 2024-01-18 DIAGNOSIS — Z419 Encounter for procedure for purposes other than remedying health state, unspecified: Secondary | ICD-10-CM | POA: Diagnosis not present

## 2024-01-19 DIAGNOSIS — F411 Generalized anxiety disorder: Secondary | ICD-10-CM | POA: Diagnosis not present

## 2024-01-26 DIAGNOSIS — F411 Generalized anxiety disorder: Secondary | ICD-10-CM | POA: Diagnosis not present

## 2024-01-31 ENCOUNTER — Other Ambulatory Visit: Payer: Self-pay | Admitting: Certified Nurse Midwife

## 2024-01-31 ENCOUNTER — Ambulatory Visit: Attending: Certified Nurse Midwife

## 2024-01-31 ENCOUNTER — Ambulatory Visit (HOSPITAL_BASED_OUTPATIENT_CLINIC_OR_DEPARTMENT_OTHER): Admitting: Obstetrics and Gynecology

## 2024-01-31 ENCOUNTER — Other Ambulatory Visit: Payer: Self-pay | Admitting: *Deleted

## 2024-01-31 VITALS — BP 102/59 | HR 72

## 2024-01-31 DIAGNOSIS — Z3480 Encounter for supervision of other normal pregnancy, unspecified trimester: Secondary | ICD-10-CM | POA: Diagnosis present

## 2024-01-31 DIAGNOSIS — O0942 Supervision of pregnancy with grand multiparity, second trimester: Secondary | ICD-10-CM | POA: Diagnosis not present

## 2024-01-31 DIAGNOSIS — Z641 Problems related to multiparity: Secondary | ICD-10-CM

## 2024-01-31 DIAGNOSIS — Z3A14 14 weeks gestation of pregnancy: Secondary | ICD-10-CM

## 2024-01-31 DIAGNOSIS — Z2913 Encounter for prophylactic Rho(D) immune globulin: Secondary | ICD-10-CM

## 2024-01-31 DIAGNOSIS — O09212 Supervision of pregnancy with history of pre-term labor, second trimester: Secondary | ICD-10-CM | POA: Diagnosis not present

## 2024-01-31 DIAGNOSIS — O09219 Supervision of pregnancy with history of pre-term labor, unspecified trimester: Secondary | ICD-10-CM | POA: Insufficient documentation

## 2024-01-31 DIAGNOSIS — O0990 Supervision of high risk pregnancy, unspecified, unspecified trimester: Secondary | ICD-10-CM

## 2024-01-31 DIAGNOSIS — Z3A21 21 weeks gestation of pregnancy: Secondary | ICD-10-CM

## 2024-01-31 DIAGNOSIS — O0943 Supervision of pregnancy with grand multiparity, third trimester: Secondary | ICD-10-CM | POA: Insufficient documentation

## 2024-01-31 DIAGNOSIS — Z8751 Personal history of pre-term labor: Secondary | ICD-10-CM

## 2024-01-31 DIAGNOSIS — O09892 Supervision of other high risk pregnancies, second trimester: Secondary | ICD-10-CM

## 2024-01-31 DIAGNOSIS — R112 Nausea with vomiting, unspecified: Secondary | ICD-10-CM

## 2024-01-31 NOTE — Progress Notes (Signed)
 Maternal-Fetal Medicine Consultation Name: Mary Underwood MRN: [id number]  [id number] at 21w gestation. Patient is here for fetal anatomy scan. On cell-free fetal DNA screening, the risks of aneuploidies are not increased.   Obstetrical history significant for 3 term vaginal deliveries followed by a spontaneous preterm delivery at 32 weeks and 6 days gestation.  Her delivery was complicated by postpartum hemorrhage, and she required misoprostol  to control hemorrhage. Patient reports no chronic medical conditions.  Ultrasound We performed fetal anatomy scan. No makers of aneuploidies or fetal structural defects are seen. Fetal biometry is consistent with her previously-established dates. Amniotic fluid is normal and good fetal activity is seen.   Because of a history of preterm delivery, we performed a transvaginal ultrasound.  The cervix measures 3 cm, which is normal.  No shortening or funneling was seen on transfundal pressure.  Patient understands the limitations of ultrasound in detecting fetal anomalies.   History of preterm delivery I counseled the patient that history of preterm delivery increases the risk of spontaneous preterm birth and subsequent pregnancies.  We have been prescribing 17 alpha hydroxyprogesterone in women who had history of preterm delivery.  Since the trials have shown that it does not benefit in preventing preterm delivery, it has been withdrawn from the market.  I reassured the patient of normal cervical length measurement and that vaginal progesterone may not benefit.  Patient opted not have vaginal progesterone.  Grand Multiparity and Obstetric Outcomes Adverse outcomes are expected to increase with grand multiparity.  However, grand multiparity itself may not be an independent risk factor for adverse outcomes. Associated conditions are to be considered.  Postpartum hemorrhage (PPH) is increased with history of PPH.  Patient gives history of postpartum hemorrhage.   Prenatal blood work showed that she does not have anemia.  I discussed the importance of prevention of anemia.    Other complications include shoulder dystocia (with history of shoulder dystocia), low birthweight (associated with advanced maternal age) and hypertensive disorders in pregnancy (history of hypertension).  Birth weight over 4000 g or increase in women with obesity.  Oxytocin  should be used judiciously to prevent uterine rupture.   Recommendations - Appointment was made for her to return in 8 weeks for fetal growth assessment.   Consultation including face-to-face (more than 50%) counseling 30 minutes.

## 2024-02-02 DIAGNOSIS — F411 Generalized anxiety disorder: Secondary | ICD-10-CM | POA: Diagnosis not present

## 2024-02-04 DIAGNOSIS — F411 Generalized anxiety disorder: Secondary | ICD-10-CM | POA: Diagnosis not present

## 2024-02-06 ENCOUNTER — Telehealth: Admitting: Family Medicine

## 2024-02-06 DIAGNOSIS — R233 Spontaneous ecchymoses: Secondary | ICD-10-CM

## 2024-02-06 NOTE — Progress Notes (Signed)
 Virtual Visit Consent   Mary Underwood, you are scheduled for a virtual visit with a [place] provider today. Just as with appointments in the office, your consent must be obtained to participate. Your consent will be active for this visit and any virtual visit you may have with one of our providers in the next 365 days. If you have a MyChart account, a copy of this consent can be sent to you electronically.  As this is a virtual visit, video technology does not allow for your provider to perform a traditional examination. This may limit your provider's ability to fully assess your condition. If your provider identifies any concerns that need to be evaluated in person or the need to arrange testing (such as labs, EKG, etc.), we will make arrangements to do so. Although advances in technology are sophisticated, we cannot ensure that it will always work on either your end or our end. If the connection with a video visit is poor, the visit may have to be switched to a telephone visit. With either a video or telephone visit, we are not always able to ensure that we have a secure connection.  By engaging in this virtual visit, you consent to the provision of healthcare and authorize for your insurance to be billed (if applicable) for the services provided during this visit. Depending on your insurance coverage, you may receive a charge related to this service.  I need to obtain your verbal consent now. Are you willing to proceed with your visit today? Teddy Pena has provided verbal consent on 02/06/2024 for a virtual visit (video or telephone). Chiquita CHRISTELLA Barefoot, NP  Date: 02/06/2024 3:46 PM   Virtual Visit via Video Note   I, Chiquita CHRISTELLA Barefoot, connected with  Mary Underwood  ([id number], Aug 24, 1995) on 02/06/24 at  3:45 PM EDT by a video-enabled telemedicine application and verified that I am speaking with the correct person using two identifiers.  Location: Patient: Virtual Visit Location Patient:  Home Provider: Virtual Visit Location Provider: Home Office   I discussed the limitations of evaluation and management by telemedicine and the availability of in person appointments. The patient expressed understanding and agreed to proceed.    History of Present Illness: Mary Underwood is a 28 y.o. who identifies as a female who was assigned female at birth, and is being seen today for questionable rash/red spot on left thigh.  Denies having any known injury or trauma to the area.  Reports that she has been a little bit ago but they seem like they are bigger spots several weeks ago.  Was not seen for this.  Reports it went away and now they are back.  Reports she does carry a sickle cell trait.  Denies having any fevers, chills, weakness, fatigue, itching, pain.  Denies having any signs or symptoms of current labor.  Is currently 26 weeks and 6 days pregnant   Problems:  Patient Active Problem List   Diagnosis Date Noted   Supervision of high-risk pregnancy 12/18/2023   Grand multipara 12/18/2023   Previous preterm delivery at 32 weeks, antepartum 12/18/2023    Allergies:  Allergies  Allergen Reactions   Pollen Extract    Medications:  Current Outpatient Medications:    metoCLOPramide  (REGLAN ) 10 MG tablet, Take 1 tablet (10 mg total) by mouth every 8 (eight) hours as needed for nausea. (Patient not taking: Reported on 01/31/2024), Disp: 30 tablet, Rfl: 0   Prenatal Vit-Fe Fumarate-FA (PRENATAL MULTIVITAMIN) TABS tablet, Take 1 tablet  by mouth daily at 12 noon., Disp: , Rfl:   Observations/Objective: Patient is well-developed, well-nourished in no acute distress.  Resting comfortably  at home.  Head is normocephalic, atraumatic.  No labored breathing.  Speech is clear and coherent with logical content.  Patient is alert and oriented at baseline.  Noted to look like she has  Purpura/ Petechiae on the anterior aspect of the left thigh.  Assessment and Plan:  1. Skin spots, red  (Primary)  Given pregnancy, sickle cell trait carrier, purpura/petechiae appearance of spots advised that she should be seen in person to get lab work to rule out thrombocytopenia, HSP, other issues that could be causing similar vasculitis..   Reviewed side effects, risks and benefits of medication.    Patient acknowledged agreement and understanding of the plan.   Past Medical, Surgical, Social History, Allergies, and Medications have been Reviewed.    Follow Up Instructions: I discussed the assessment and treatment plan with the patient. The patient was provided an opportunity to ask questions and all were answered. The patient agreed with the plan and demonstrated an understanding of the instructions.  A copy of instructions were sent to the patient via MyChart unless otherwise noted below.    The patient was advised to call back or seek an in-person evaluation if the symptoms worsen or if the condition fails to improve as anticipated.    Chiquita CHRISTELLA Barefoot, NP

## 2024-02-06 NOTE — Patient Instructions (Signed)
 Please go to be seen in person at your GYN office if they cannot see if they want to go to the MAU and/or local urgent care

## 2024-02-09 DIAGNOSIS — F411 Generalized anxiety disorder: Secondary | ICD-10-CM | POA: Diagnosis not present

## 2024-02-11 DIAGNOSIS — F411 Generalized anxiety disorder: Secondary | ICD-10-CM | POA: Diagnosis not present

## 2024-02-12 ENCOUNTER — Telehealth: Admitting: Obstetrics and Gynecology

## 2024-02-12 DIAGNOSIS — L989 Disorder of the skin and subcutaneous tissue, unspecified: Secondary | ICD-10-CM

## 2024-02-12 DIAGNOSIS — O09219 Supervision of pregnancy with history of pre-term labor, unspecified trimester: Secondary | ICD-10-CM

## 2024-02-12 DIAGNOSIS — Z641 Problems related to multiparity: Secondary | ICD-10-CM

## 2024-02-12 DIAGNOSIS — O26899 Other specified pregnancy related conditions, unspecified trimester: Secondary | ICD-10-CM

## 2024-02-12 DIAGNOSIS — Z6791 Unspecified blood type, Rh negative: Secondary | ICD-10-CM | POA: Diagnosis not present

## 2024-02-12 NOTE — Addendum Note (Signed)
 Addended by: Devantae Babe on: 02/12/2024 02:00 PM   Modules accepted: Level of Service

## 2024-02-12 NOTE — Progress Notes (Signed)
 Virtual ROB   RR:Wnwz   Not able to check B/P at this time.

## 2024-02-12 NOTE — Progress Notes (Signed)
    TELEHEALTH OBSTETRICS VISIT ENCOUNTER NOTE  Provider location: [facility]   Patient location: Home  I connected with Mary Underwood on 02/12/24 at  1:30 PM EDT by telephone at home and verified that I am speaking with the correct person using two identifiers. Of note, unable to do video encounter due to technical difficulties.    I discussed the limitations, risks, security and privacy concerns of performing an evaluation and management service by telephone and the availability of in person appointments. I also discussed with the patient that there may be a patient responsible charge related to this service. The patient expressed understanding and agreed to proceed.  Subjective:  Mary Underwood is a 28 y.o. [id number] at [redacted]w[redacted]d being followed for ongoing prenatal care.  She is currently monitored for the following issues for this high-risk pregnancy and has Rh negative state in antepartum period; Supervision of high-risk pregnancy; Grand multipara; and Previous preterm delivery at 32 weeks, antepartum on their problem list.  Patient reports some skin lesions on her legs for the past month and getting better. Reports fetal movement. Denies any contractions, bleeding or leaking of fluid.   The following portions of the patient's history were reviewed and updated as appropriate: allergies, current medications, past family history, past medical history, past social history, past surgical history and problem list.   Objective:  Last menstrual period 09/06/2023. General:  Alert, oriented and cooperative.   Mental Status: Normal mood and affect perceived. Normal judgment and thought content.  Rest of physical exam deferred due to type of encounter  Assessment and Plan:  Pregnancy: [id number] at [redacted]w[redacted]d 1. Rh negative state in antepartum period (Primary) rhogam  2. Previous preterm delivery at 32 weeks, antepartum 7/25 mfm anatomy u/s and cervical length wnl. Rpt  in 2 months  3. Grand multipara  4. Skin lesions ROS and hx negative. I told her likely just benign pregnancy changes and just expectant management recommended.   Preterm labor symptoms and general obstetric precautions including but not limited to vaginal bleeding, contractions, leaking of fluid and fetal movement were reviewed in detail with the patient.  I discussed the assessment and treatment plan with the patient. The patient was provided an opportunity to ask questions and all were answered. The patient agreed with the plan and demonstrated an understanding of the instructions. The patient was advised to call back or seek an in-person office evaluation/go to MAU at [facility] for any urgent or concerning symptoms. Please refer to After Visit Summary for other counseling recommendations.   I provided 7 minutes of non-face-to-face time during this encounter.  No follow-ups on file.  Future Appointments  Date Time Provider Department Center  03/11/2024  4:10 PM Izell Harari, MD [facility]  03/25/2024  8:15 AM [facility] LAB [facility]  03/25/2024  8:35 AM Izell Harari, MD [facility]  03/27/2024 11:00 AM [facility] PROVIDER 1 [facility]  03/27/2024 11:30 AM [facility] US4 [facility]    Harari Izell, MD Center for [employer], [facility]

## 2024-02-16 DIAGNOSIS — F411 Generalized anxiety disorder: Secondary | ICD-10-CM | POA: Diagnosis not present

## 2024-02-18 DIAGNOSIS — F411 Generalized anxiety disorder: Secondary | ICD-10-CM | POA: Diagnosis not present

## 2024-02-18 DIAGNOSIS — Z419 Encounter for procedure for purposes other than remedying health state, unspecified: Secondary | ICD-10-CM | POA: Diagnosis not present

## 2024-02-23 DIAGNOSIS — F411 Generalized anxiety disorder: Secondary | ICD-10-CM | POA: Diagnosis not present

## 2024-02-25 DIAGNOSIS — F411 Generalized anxiety disorder: Secondary | ICD-10-CM | POA: Diagnosis not present

## 2024-03-01 DIAGNOSIS — F411 Generalized anxiety disorder: Secondary | ICD-10-CM | POA: Diagnosis not present

## 2024-03-03 DIAGNOSIS — F411 Generalized anxiety disorder: Secondary | ICD-10-CM | POA: Diagnosis not present

## 2024-03-08 DIAGNOSIS — F411 Generalized anxiety disorder: Secondary | ICD-10-CM | POA: Diagnosis not present

## 2024-03-10 DIAGNOSIS — F411 Generalized anxiety disorder: Secondary | ICD-10-CM | POA: Diagnosis not present

## 2024-03-11 ENCOUNTER — Encounter: Admitting: Obstetrics and Gynecology

## 2024-03-15 DIAGNOSIS — F411 Generalized anxiety disorder: Secondary | ICD-10-CM | POA: Diagnosis not present

## 2024-03-17 ENCOUNTER — Encounter: Admitting: Family Medicine

## 2024-03-17 ENCOUNTER — Telehealth: Payer: Self-pay

## 2024-03-17 ENCOUNTER — Encounter: Payer: Self-pay | Admitting: Family Medicine

## 2024-03-17 DIAGNOSIS — F411 Generalized anxiety disorder: Secondary | ICD-10-CM | POA: Diagnosis not present

## 2024-03-17 NOTE — Progress Notes (Signed)
Patient did not keep appointment today. She will be called to reschedule.  

## 2024-03-17 NOTE — Telephone Encounter (Signed)
 Spoke to pt regarding missed appt today. She states she does not have any concerns at the moment and will come in on her visit scheduled 9/17. She did not have transportation today.

## 2024-03-20 DIAGNOSIS — Z419 Encounter for procedure for purposes other than remedying health state, unspecified: Secondary | ICD-10-CM | POA: Diagnosis not present

## 2024-03-22 DIAGNOSIS — F411 Generalized anxiety disorder: Secondary | ICD-10-CM | POA: Diagnosis not present

## 2024-03-25 ENCOUNTER — Encounter: Admitting: Obstetrics and Gynecology

## 2024-03-25 ENCOUNTER — Other Ambulatory Visit

## 2024-03-27 ENCOUNTER — Ambulatory Visit (HOSPITAL_BASED_OUTPATIENT_CLINIC_OR_DEPARTMENT_OTHER)

## 2024-03-27 ENCOUNTER — Other Ambulatory Visit: Payer: Self-pay | Admitting: *Deleted

## 2024-03-27 ENCOUNTER — Ambulatory Visit: Attending: Obstetrics and Gynecology | Admitting: Obstetrics

## 2024-03-27 DIAGNOSIS — O0943 Supervision of pregnancy with grand multiparity, third trimester: Secondary | ICD-10-CM | POA: Diagnosis not present

## 2024-03-27 DIAGNOSIS — Z3A29 29 weeks gestation of pregnancy: Secondary | ICD-10-CM

## 2024-03-27 DIAGNOSIS — Z641 Problems related to multiparity: Secondary | ICD-10-CM

## 2024-03-27 DIAGNOSIS — O43193 Other malformation of placenta, third trimester: Secondary | ICD-10-CM

## 2024-03-27 DIAGNOSIS — O09219 Supervision of pregnancy with history of pre-term labor, unspecified trimester: Secondary | ICD-10-CM

## 2024-03-27 DIAGNOSIS — O09293 Supervision of pregnancy with other poor reproductive or obstetric history, third trimester: Secondary | ICD-10-CM | POA: Insufficient documentation

## 2024-03-27 DIAGNOSIS — O09213 Supervision of pregnancy with history of pre-term labor, third trimester: Secondary | ICD-10-CM

## 2024-03-27 NOTE — Progress Notes (Signed)
   Patient information  Patient Name: Mary Underwood  Patient MRN:   [id number]  Referring practice: MFM Referring Provider: [facility] OBGYN  Problem List   Patient Active Problem List   Diagnosis Date Noted   Marginal insertion of umbilical cord affecting management of mother in third trimester 03/27/2024   Supervision of high-risk pregnancy 12/18/2023   Grand multipara 12/18/2023   Previous preterm delivery at 32 weeks, antepartum 12/18/2023   Rh negative state in antepartum period 11/17/2018    Maternal Fetal medicine Consult  Mary Underwood is a 28 y.o. [id number] at [redacted]w[redacted]d here for ultrasound and consultation. Mary Underwood is doing well today with no acute concerns. Today we focused on the following:   The patient is here for a growth ultrasound due to her marginal cord insertion.  I discussed that the fetal growth is normal and she will return in 5 weeks for another growth ultrasound.  The patient had time to ask questions that were answered to her satisfaction.  She verbalized understanding and agrees to proceed with the plan below.  Sonographic findings Single intrauterine pregnancy at 29w 0d.  Fetal cardiac activity:  Observed and appears normal. Presentation: Cephalic. Interval fetal anatomy appears normal. Fetal biometry shows the estimated fetal weight at the 43 percentile. Amniotic fluid volume: Within normal limits. MVP: 5.18 cm. Placenta: Anterior.  There are limitations of prenatal ultrasound such as the inability to detect certain abnormalities due to poor visualization. Various factors such as fetal position, gestational age and maternal body habitus may increase the difficulty in visualizing the fetal anatomy.    Recommendations 1. Serial growth ultrasounds every 4-6 weeks until delivery  Review of Systems: A review of systems was performed and was negative except per HPI   Vitals and Physical Exam    03/27/2024   11:22 AM 01/31/2024    8:18  AM 01/15/2024    1:29 PM  Vitals with BMI  Weight   160 lbs  Systolic [phone]  Diastolic 66 59 69  Pulse 89 72 81    Sitting comfortably on the sonogram table Nonlabored breathing Normal rate and rhythm Abdomen is nontender  Past pregnancies OB History  Gravida Para Term Preterm AB Living  7 4 3 1 2 4   SAB IAB Ectopic Multiple Live Births   2  0 4    # Outcome Date GA Lbr Len/2nd Weight Sex Type Anes PTL Lv  7 Current           6 IAB 04/12/20          5 Preterm 11/17/18 [redacted]w[redacted]d 03:30 / 00:05 4 lb 12.5 oz (2.17 kg) M Vag-Spont None  LIV  4 Term 11/03/17    F Vag-Spont   LIV  3 Term 02/26/16    F Vag-Spont   LIV  2 Term 10/14/13    F Vag-Spont   LIV  1 IAB              I spent 20 past minutes reviewing the patients chart, including labs and images as well as counseling the patient about her medical conditions. Greater than 50% of the time was spent in direct face-to-face patient counseling.  Mary Underwood  MFM, [facility]   03/27/2024  11:52 AM

## 2024-03-29 DIAGNOSIS — F411 Generalized anxiety disorder: Secondary | ICD-10-CM | POA: Diagnosis not present

## 2024-03-31 ENCOUNTER — Other Ambulatory Visit (HOSPITAL_COMMUNITY)
Admission: RE | Admit: 2024-03-31 | Discharge: 2024-03-31 | Disposition: A | Discharge status: Home / Self Care | Source: Ambulatory Visit | Attending: Obstetrics and Gynecology | Admitting: Obstetrics and Gynecology

## 2024-03-31 ENCOUNTER — Other Ambulatory Visit

## 2024-03-31 ENCOUNTER — Ambulatory Visit: Admitting: Obstetrics and Gynecology

## 2024-03-31 VITALS — BP 106/71 | HR 81 | Wt 165.0 lb

## 2024-03-31 DIAGNOSIS — Z3A29 29 weeks gestation of pregnancy: Secondary | ICD-10-CM

## 2024-03-31 DIAGNOSIS — O09219 Supervision of pregnancy with history of pre-term labor, unspecified trimester: Secondary | ICD-10-CM | POA: Diagnosis not present

## 2024-03-31 DIAGNOSIS — Z113 Encounter for screening for infections with a predominantly sexual mode of transmission: Secondary | ICD-10-CM | POA: Insufficient documentation

## 2024-03-31 DIAGNOSIS — Z641 Problems related to multiparity: Secondary | ICD-10-CM

## 2024-03-31 DIAGNOSIS — N898 Other specified noninflammatory disorders of vagina: Secondary | ICD-10-CM

## 2024-03-31 DIAGNOSIS — O360931 Maternal care for other rhesus isoimmunization, third trimester, fetus 1: Secondary | ICD-10-CM | POA: Diagnosis not present

## 2024-03-31 DIAGNOSIS — Z3483 Encounter for supervision of other normal pregnancy, third trimester: Secondary | ICD-10-CM

## 2024-03-31 DIAGNOSIS — F411 Generalized anxiety disorder: Secondary | ICD-10-CM | POA: Diagnosis not present

## 2024-03-31 DIAGNOSIS — O0993 Supervision of high risk pregnancy, unspecified, third trimester: Secondary | ICD-10-CM

## 2024-03-31 DIAGNOSIS — O43193 Other malformation of placenta, third trimester: Secondary | ICD-10-CM

## 2024-03-31 DIAGNOSIS — Z6791 Unspecified blood type, Rh negative: Secondary | ICD-10-CM | POA: Diagnosis not present

## 2024-03-31 DIAGNOSIS — O26899 Other specified pregnancy related conditions, unspecified trimester: Secondary | ICD-10-CM

## 2024-03-31 LAB — POCT URINALYSIS DIPSTICK
Bilirubin, UA: NEGATIVE
Blood, UA: NEGATIVE
Glucose, UA: NEGATIVE
Ketones, UA: NEGATIVE
Nitrite, UA: NEGATIVE
Protein, UA: NEGATIVE
Spec Grav, UA: 1.01 (ref 1.010–1.025)
Urobilinogen, UA: 0.2 U/dL
pH, UA: 6.5 (ref 5.0–8.0)

## 2024-03-31 MED ORDER — RHO D IMMUNE GLOBULIN 1500 UNIT/2ML IJ SOSY
300.0000 ug | PREFILLED_SYRINGE | Freq: Once | INTRAMUSCULAR | Status: AC
  Administered 2024-03-31: 300 ug via INTRAMUSCULAR

## 2024-03-31 NOTE — Progress Notes (Unsigned)
   PRENATAL VISIT NOTE  Subjective:  Mary Underwood is a 28 y.o. [phone] at [redacted]w[redacted]d being seen today for ongoing prenatal care.  She is currently monitored for the following issues for this high-risk pregnancy and has Rh negative state in antepartum period; Supervision of high-risk pregnancy; Grand multipara; Previous preterm delivery at 32 weeks, antepartum; and Marginal insertion of umbilical cord affecting management of mother in third trimester on their problem list.  Patient reports vaginal discharge and odor.  Contractions: Not present. Vag. Bleeding: None.  Movement: Present. Denies leaking of fluid.   The following portions of the patient's history were reviewed and updated as appropriate: allergies, current medications, past family history, past medical history, past social history, past surgical history and problem list.   Objective:    Vitals:   03/31/24 0902  BP: 106/71  Pulse: 81  Weight: 165 lb (74.8 kg)    Fetal Status:  Fetal Heart Rate (bpm): 150   Movement: Present    General: Alert, oriented and cooperative. Patient is in no acute distress.  Skin: Skin is warm and dry. No rash noted.   Cardiovascular: Normal heart rate noted  Respiratory: Normal respiratory effort, no problems with respiration noted  Abdomen: Soft, gravid, appropriate for gestational age.  Pain/Pressure: Absent     Pelvic: Cervical exam deferred        Extremities: Normal range of motion.  Edema: None  Mental Status: Normal mood and affect. Normal behavior. Normal judgment and thought content.   Assessment and Plan:  Pregnancy: [id number] at [redacted]w[redacted]d 1. [redacted] weeks gestation of pregnancy (Primary) Depo provera  - Cervicovaginal ancillary only( [place]) - Culture, OB Urine - POCT Urinalysis Dipstick  2. Rh negative state in antepartum period Rhogam and antibody screen today  3. Grand multipara  4. Marginal insertion of umbilical cord affecting management of mother in third trimester Continue  with serial scans 9/19: efw 43%, 1346g, ac 44%, afi 15  5. Previous preterm delivery at 32 weeks, antepartum No current interventions indicated  6. Supervision of high risk pregnancy in third trimester  7. Vaginal discharge - Cervicovaginal ancillary only( [place]) - Culture, OB Urine - POCT Urinalysis Dipstick  8. Vaginal odor - Cervicovaginal ancillary only( [place]) - Culture, OB Urine - POCT Urinalysis Dipstick  Preterm labor symptoms and general obstetric precautions including but not limited to vaginal bleeding, contractions, leaking of fluid and fetal movement were reviewed in detail with the patient. Please refer to After Visit Summary for other counseling recommendations.   No follow-ups on file.  Future Appointments  Date Time Provider Department Center  04/30/2024  2:00 PM [facility] PROVIDER 1 [facility]  04/30/2024  2:15 PM [facility] US1 [facility]  05/28/2024  1:15 PM [facility] PROVIDER 1 [facility]  05/28/2024  1:30 PM [facility] US2 [facility]    Bebe Furry, MD

## 2024-03-31 NOTE — Progress Notes (Unsigned)
 ROB   Needs Rhogam T-Dap: Declines Flu Vaccine: Declines  CC: Possible yeast infection/UTI pt leaving urine and doing self swab.

## 2024-04-01 ENCOUNTER — Other Ambulatory Visit: Payer: Self-pay | Admitting: *Deleted

## 2024-04-01 LAB — CERVICOVAGINAL ANCILLARY ONLY
Bacterial Vaginitis (gardnerella): POSITIVE — AB
Candida Glabrata: NEGATIVE
Candida Vaginitis: POSITIVE — AB
Chlamydia: NEGATIVE
Comment: NEGATIVE
Comment: NEGATIVE
Comment: NEGATIVE
Comment: NEGATIVE
Comment: NEGATIVE
Comment: NORMAL
Neisseria Gonorrhea: NEGATIVE
Trichomonas: NEGATIVE

## 2024-04-01 LAB — CBC
Hematocrit: 35.2 % (ref 34.0–46.6)
Hemoglobin: 11.6 g/dL (ref 11.1–15.9)
MCH: 31 pg (ref 26.6–33.0)
MCHC: 33 g/dL (ref 31.5–35.7)
MCV: 94 fL (ref 79–97)
Platelets: 291 x10E3/uL (ref 150–450)
RBC: 3.74 x10E6/uL — ABNORMAL LOW (ref 3.77–5.28)
RDW: 13.9 % (ref 11.7–15.4)
WBC: 9.1 x10E3/uL (ref 3.4–10.8)

## 2024-04-01 LAB — GLUCOSE TOLERANCE, 2 HOURS W/ 1HR
Glucose, 1 hour: 104 mg/dL (ref 70–179)
Glucose, 2 hour: 100 mg/dL (ref 70–152)
Glucose, Fasting: 73 mg/dL (ref 70–91)

## 2024-04-01 LAB — HIV ANTIBODY (ROUTINE TESTING W REFLEX): HIV Screen 4th Generation wRfx: NONREACTIVE

## 2024-04-01 LAB — RPR: RPR Ser Ql: NONREACTIVE

## 2024-04-01 LAB — ANTIBODY SCREEN: Antibody Screen: NEGATIVE

## 2024-04-01 MED ORDER — METRONIDAZOLE 500 MG PO TABS: 500.0000 mg | ORAL_TABLET | Freq: Two times a day (BID) | ORAL | 0 refills | Status: DC

## 2024-04-01 MED ORDER — TERCONAZOLE 0.8 % VA CREA: 1.0000 | TOPICAL_CREAM | Freq: Every day | VAGINAL | 0 refills | Status: AC

## 2024-04-02 ENCOUNTER — Ambulatory Visit: Payer: Self-pay | Admitting: Obstetrics and Gynecology

## 2024-04-02 LAB — URINE CULTURE, OB REFLEX

## 2024-04-02 LAB — CULTURE, OB URINE

## 2024-04-05 DIAGNOSIS — F411 Generalized anxiety disorder: Secondary | ICD-10-CM | POA: Diagnosis not present

## 2024-04-07 DIAGNOSIS — F411 Generalized anxiety disorder: Secondary | ICD-10-CM | POA: Diagnosis not present

## 2024-04-10 MED ORDER — MICONAZOLE NITRATE 2 % VA CREA: 1.0000 | TOPICAL_CREAM | Freq: Every day | VAGINAL | 2 refills | Status: AC

## 2024-04-10 MED ORDER — METRONIDAZOLE 500 MG PO TABS: 500.0000 mg | ORAL_TABLET | Freq: Two times a day (BID) | ORAL | 0 refills | Status: AC

## 2024-04-12 DIAGNOSIS — F411 Generalized anxiety disorder: Secondary | ICD-10-CM | POA: Diagnosis not present

## 2024-04-14 DIAGNOSIS — F411 Generalized anxiety disorder: Secondary | ICD-10-CM | POA: Diagnosis not present

## 2024-04-20 ENCOUNTER — Ambulatory Visit: Admitting: Family Medicine

## 2024-04-20 VITALS — BP 106/68 | HR 85 | Wt 169.0 lb

## 2024-04-20 DIAGNOSIS — Z23 Encounter for immunization: Secondary | ICD-10-CM

## 2024-04-20 DIAGNOSIS — O09219 Supervision of pregnancy with history of pre-term labor, unspecified trimester: Secondary | ICD-10-CM

## 2024-04-20 DIAGNOSIS — O0993 Supervision of high risk pregnancy, unspecified, third trimester: Secondary | ICD-10-CM | POA: Diagnosis not present

## 2024-04-20 DIAGNOSIS — Z3A32 32 weeks gestation of pregnancy: Secondary | ICD-10-CM

## 2024-04-20 DIAGNOSIS — Z7689 Persons encountering health services in other specified circumstances: Secondary | ICD-10-CM | POA: Diagnosis not present

## 2024-04-20 DIAGNOSIS — O26899 Other specified pregnancy related conditions, unspecified trimester: Secondary | ICD-10-CM | POA: Diagnosis not present

## 2024-04-20 DIAGNOSIS — Z641 Problems related to multiparity: Secondary | ICD-10-CM | POA: Diagnosis not present

## 2024-04-20 DIAGNOSIS — O43193 Other malformation of placenta, third trimester: Secondary | ICD-10-CM | POA: Diagnosis not present

## 2024-04-20 DIAGNOSIS — Z6791 Unspecified blood type, Rh negative: Secondary | ICD-10-CM

## 2024-04-20 NOTE — Progress Notes (Signed)
 ROB

## 2024-04-20 NOTE — Progress Notes (Signed)
   PRENATAL VISIT NOTE  Subjective:  Aljean Horiuchi is a 28 y.o. [phone] at [redacted]w[redacted]d being seen today for ongoing prenatal care.  She is currently monitored for the following issues for this high-risk pregnancy and has Rh negative state in antepartum period; Supervision of high-risk pregnancy; Grand multipara; Previous preterm delivery at 32 weeks, antepartum; and Marginal insertion of umbilical cord affecting management of mother in third trimester on their problem list.  Patient reports no complaints.  Contractions: Irritability. Vag. Bleeding: None.  Movement: Present. Denies leaking of fluid.   The following portions of the patient's history were reviewed and updated as appropriate: allergies, current medications, past family history, past medical history, past social history, past surgical history and problem list.   Objective:    Vitals:   04/20/24 1606  BP: 106/68  Pulse: 85  Weight: 169 lb (76.7 kg)    Fetal Status:  Fetal Heart Rate (bpm): 158   Movement: Present    General: Alert, oriented and cooperative. Patient is in no acute distress.  Skin: Skin is warm and dry. No rash noted.   Cardiovascular: Normal heart rate noted  Respiratory: Normal respiratory effort, no problems with respiration noted  Abdomen: Soft, gravid, appropriate for gestational age.  Pain/Pressure: Present     Pelvic: Cervical exam deferred        Extremities: Normal range of motion.  Edema: None  Mental Status: Normal mood and affect. Normal behavior. Normal judgment and thought content.   Assessment and Plan:  Pregnancy: [id number] at [redacted]w[redacted]d 1. Marginal insertion of umbilical cord affecting management of mother in third trimester (Primary) Growth US   2. Supervision of high risk pregnancy in third trimester UTD Vigorous movement Had some contractions last night that resolved No LOF, none currently Accepts TDap today Declined flu shot Desires depo for contraception  3. Rh negative state in  antepartum period S/p rhogam   4. Previous preterm delivery at 32 weeks, antepartum Reviewed that if ctx worsen she needs to go to the hospital  5. Grand multipara  6. [redacted] weeks gestation of pregnancy  Preterm labor symptoms and general obstetric precautions including but not limited to vaginal bleeding, contractions, leaking of fluid and fetal movement were reviewed in detail with the patient. Please refer to After Visit Summary for other counseling recommendations.   Return in about 2 weeks (around 05/04/2024) for Routine prenatal care.  Future Appointments  Date Time Provider Department Center  04/30/2024  2:00 PM [facility] PROVIDER 1 [facility]  04/30/2024  2:15 PM [facility] US1 [facility]  05/28/2024  1:15 PM [facility] PROVIDER 1 [facility]  05/28/2024  1:30 PM [facility] US2 [facility]    Suzen Maryan Masters, MD

## 2024-04-30 ENCOUNTER — Ambulatory Visit (HOSPITAL_BASED_OUTPATIENT_CLINIC_OR_DEPARTMENT_OTHER)

## 2024-04-30 ENCOUNTER — Ambulatory Visit: Attending: Maternal & Fetal Medicine | Admitting: Maternal & Fetal Medicine

## 2024-04-30 DIAGNOSIS — Z362 Encounter for other antenatal screening follow-up: Secondary | ICD-10-CM | POA: Diagnosis not present

## 2024-04-30 DIAGNOSIS — O0943 Supervision of pregnancy with grand multiparity, third trimester: Secondary | ICD-10-CM | POA: Insufficient documentation

## 2024-04-30 DIAGNOSIS — O43193 Other malformation of placenta, third trimester: Secondary | ICD-10-CM | POA: Diagnosis not present

## 2024-04-30 DIAGNOSIS — Z3A33 33 weeks gestation of pregnancy: Secondary | ICD-10-CM | POA: Diagnosis not present

## 2024-04-30 DIAGNOSIS — Z641 Problems related to multiparity: Secondary | ICD-10-CM

## 2024-04-30 DIAGNOSIS — Z7689 Persons encountering health services in other specified circumstances: Secondary | ICD-10-CM | POA: Diagnosis not present

## 2024-04-30 DIAGNOSIS — O09293 Supervision of pregnancy with other poor reproductive or obstetric history, third trimester: Secondary | ICD-10-CM | POA: Diagnosis not present

## 2024-04-30 DIAGNOSIS — O09219 Supervision of pregnancy with history of pre-term labor, unspecified trimester: Secondary | ICD-10-CM

## 2024-04-30 DIAGNOSIS — O09213 Supervision of pregnancy with history of pre-term labor, third trimester: Secondary | ICD-10-CM

## 2024-04-30 NOTE — Progress Notes (Signed)
   Patient information  Patient Name: Mary Underwood  Patient MRN:   [id number]  Referring practice: MFM Referring Provider: [facility] OBGYN  Problem List   Patient Active Problem List   Diagnosis Date Noted   Marginal insertion of umbilical cord affecting management of mother in third trimester 03/27/2024   Supervision of high-risk pregnancy 12/18/2023   Grand multipara 12/18/2023   Previous preterm delivery at 32 weeks, antepartum 12/18/2023   Rh negative state in antepartum period 11/17/2018   Maternal Fetal medicine Consult  Mary Underwood is a 28 y.o. [id number] at [redacted]w[redacted]d here for ultrasound and consultation. Mary Underwood is doing well today with no acute concerns. Today we focused on the following:   The patient is here for an ultrasound due to marginal cord insertion.  The fetal growth is normal.  She will return in 4 weeks for another growth ultrasound.  The patient had time to ask questions that were answered to her satisfaction.  She verbalized understanding and agrees to proceed with the plan below.  Sonographic findings Single intrauterine pregnancy at 33w 6d.  Fetal cardiac activity:  Observed and appears normal. Presentation: Cephalic. Interval fetal anatomy appears normal. Fetal biometry shows the estimated fetal weight at the 45 percentile. Amniotic fluid volume: Within normal limits. MVP: 7.33 cm. Placenta: Anterior.  There are limitations of prenatal ultrasound such as the inability to detect certain abnormalities due to poor visualization. Various factors such as fetal position, gestational age and maternal body habitus may increase the difficulty in visualizing the fetal anatomy.    Recommendations Follow-up for 4-week growth ultrasound  Review of Systems: A review of systems was performed and was negative except per HPI   Vitals and Physical Exam    04/30/2024    2:14 PM 04/20/2024    4:06 PM 03/31/2024    9:02 AM  Vitals with BMI   Weight  169 lbs 165 lbs  Systolic 113 106 893  Diastolic 66 68 71  Pulse 83 85 81    Sitting comfortably on the sonogram table Nonlabored breathing Normal rate and rhythm Abdomen is nontender  Past pregnancies OB History  Gravida Para Term Preterm AB Living  7 4 3 1 2 4   SAB IAB Ectopic Multiple Live Births   2  0 4    # Outcome Date GA Lbr Len/2nd Weight Sex Type Anes PTL Lv  7 Current           6 IAB 04/12/20          5 Preterm 11/17/18 [redacted]w[redacted]d 03:30 / 00:05 4 lb 12.5 oz (2.17 kg) M Vag-Spont None  LIV  4 Term 11/03/17    F Vag-Spont   LIV  3 Term 02/26/16    F Vag-Spont   LIV  2 Term 10/14/13    F Vag-Spont   LIV  1 IAB              I spent 10 minutes reviewing the patients chart, including labs and images as well as counseling the patient about her medical conditions. Greater than 50% of the time was spent in direct face-to-face patient counseling.  Mary Underwood  MFM, [facility]   04/30/2024  4:53 PM

## 2024-05-04 ENCOUNTER — Encounter: Payer: Self-pay | Admitting: Family Medicine

## 2024-05-04 ENCOUNTER — Ambulatory Visit: Admitting: Family Medicine

## 2024-05-04 VITALS — BP 107/71 | HR 83 | Wt 175.0 lb

## 2024-05-04 DIAGNOSIS — O0993 Supervision of high risk pregnancy, unspecified, third trimester: Secondary | ICD-10-CM | POA: Diagnosis not present

## 2024-05-04 DIAGNOSIS — O26899 Other specified pregnancy related conditions, unspecified trimester: Secondary | ICD-10-CM

## 2024-05-04 DIAGNOSIS — O09219 Supervision of pregnancy with history of pre-term labor, unspecified trimester: Secondary | ICD-10-CM

## 2024-05-04 DIAGNOSIS — Z641 Problems related to multiparity: Secondary | ICD-10-CM

## 2024-05-04 DIAGNOSIS — Z6791 Unspecified blood type, Rh negative: Secondary | ICD-10-CM

## 2024-05-04 NOTE — Progress Notes (Signed)
   PRENATAL VISIT NOTE  Subjective:  Mary Underwood is a 28 y.o. [id number] at [redacted]w[redacted]d being seen today for ongoing prenatal care.  She is currently monitored for the following issues for this high-risk pregnancy and has Rh negative state in antepartum period; Supervision of high-risk pregnancy; Grand multipara; Previous preterm delivery at 32 weeks, antepartum; and Marginal insertion of umbilical cord affecting management of mother in third trimester on their problem list.  Patient reports no complaints.  Contractions: Irritability. Vag. Bleeding: None.  Movement: Present. Denies leaking of fluid.   The following portions of the patient's history were reviewed and updated as appropriate: allergies, current medications, past family history, past medical history, past social history, past surgical history and problem list.   Objective:    Vitals:   05/04/24 1525  BP: 107/71  Pulse: 83  Weight: 175 lb (79.4 kg)    Fetal Status:  Fetal Heart Rate (bpm): 147 Fundal Height: 33 cm Movement: Present Presentation: Vertex  General: Alert, oriented and cooperative. Patient is in no acute distress.  Skin: Skin is warm and dry. No rash noted.   Cardiovascular: Normal heart rate noted  Respiratory: Normal respiratory effort, no problems with respiration noted  Abdomen: Soft, gravid, appropriate for gestational age.  Pain/Pressure: Present     Pelvic: Cervical exam deferred Dilation: 1.5 Effacement (%): Thick Station: -3  Extremities: Normal range of motion.  Edema: None  Mental Status: Normal mood and affect. Normal behavior. Normal judgment and thought content.   Assessment and Plan:  Pregnancy: [id number] at [redacted]w[redacted]d 1. Supervision of high risk pregnancy in third trimester (Primary) Up to date FH appropriate Concerns-- having pain and pressure. Seen at [facility] and was 2cm.  CTX have worsened but are not more frequent. Cervical exam unchanged today. Reviewed going to [facility] for frequent ctx  2. Previous  preterm delivery at 32 weeks, antepartum  3. Rh negative state in antepartum period S/p rhogam  4. Grand multipara TXA at delivery  Preterm labor symptoms and general obstetric precautions including but not limited to vaginal bleeding, contractions, leaking of fluid and fetal movement were reviewed in detail with the patient. Please refer to After Visit Summary for other counseling recommendations.   Return in about 2 weeks (around 05/18/2024) for Routine prenatal care.  Future Appointments  Date Time Provider Department Center  05/04/2024  3:50 PM Eldonna Suzen Octave, MD [facility]  05/28/2024  1:15 PM [facility] PROVIDER 1 [facility]  05/28/2024  1:30 PM [facility] US2 [facility]    Suzen Octave Eldonna, MD

## 2024-05-04 NOTE — Progress Notes (Signed)
 ROB   CC: Mary Underwood ctx's went to [employer] 04/25/24 pt was told she was 2 cm.   Pt wants cervix check

## 2024-05-18 ENCOUNTER — Encounter: Admitting: Obstetrics and Gynecology

## 2024-05-20 DIAGNOSIS — Z3A36 36 weeks gestation of pregnancy: Secondary | ICD-10-CM | POA: Diagnosis not present

## 2024-05-20 DIAGNOSIS — O43193 Other malformation of placenta, third trimester: Secondary | ICD-10-CM | POA: Diagnosis not present

## 2024-05-20 DIAGNOSIS — O99013 Anemia complicating pregnancy, third trimester: Secondary | ICD-10-CM | POA: Diagnosis not present

## 2024-05-20 DIAGNOSIS — O479 False labor, unspecified: Secondary | ICD-10-CM | POA: Diagnosis not present

## 2024-05-20 DIAGNOSIS — Z419 Encounter for procedure for purposes other than remedying health state, unspecified: Secondary | ICD-10-CM | POA: Diagnosis not present

## 2024-05-20 DIAGNOSIS — O9902 Anemia complicating childbirth: Secondary | ICD-10-CM | POA: Diagnosis not present

## 2024-05-20 DIAGNOSIS — D509 Iron deficiency anemia, unspecified: Secondary | ICD-10-CM | POA: Diagnosis not present

## 2024-05-20 DIAGNOSIS — Z79899 Other long term (current) drug therapy: Secondary | ICD-10-CM | POA: Diagnosis not present

## 2024-05-21 DIAGNOSIS — Z6791 Unspecified blood type, Rh negative: Secondary | ICD-10-CM | POA: Diagnosis not present

## 2024-05-21 DIAGNOSIS — Z2913 Encounter for prophylactic Rho(D) immune globulin: Secondary | ICD-10-CM | POA: Diagnosis not present

## 2024-05-21 DIAGNOSIS — Z641 Problems related to multiparity: Secondary | ICD-10-CM | POA: Diagnosis not present

## 2024-05-21 DIAGNOSIS — Z8759 Personal history of other complications of pregnancy, childbirth and the puerperium: Secondary | ICD-10-CM | POA: Diagnosis not present

## 2024-05-21 DIAGNOSIS — O09893 Supervision of other high risk pregnancies, third trimester: Secondary | ICD-10-CM | POA: Diagnosis not present

## 2024-05-21 DIAGNOSIS — Z3A36 36 weeks gestation of pregnancy: Secondary | ICD-10-CM | POA: Diagnosis not present

## 2024-05-21 DIAGNOSIS — O360930 Maternal care for other rhesus isoimmunization, third trimester, not applicable or unspecified: Secondary | ICD-10-CM | POA: Diagnosis not present

## 2024-05-21 DIAGNOSIS — Z2839 Other underimmunization status: Secondary | ICD-10-CM | POA: Diagnosis not present

## 2024-05-21 NOTE — Progress Notes (Signed)
 Strip Review   6:16 AM Mary Underwood, Mary Underwood is a 28 y.o. [id number] @ [redacted]w[redacted]d admitted 05/20/2024 for PTL.  T 36.6 C (97.8 F) (05/21/24 0317) Temp  Avg: 36.7 C (98 F)  Min: 36.6 C (97.8 F)  Max: 36.8 C (98.2 F)  BP 110/74 (05/21/24 0317) BP  Min: 105/64  Max: 110/74  HR 76 (05/21/24 0317) Pulse  Avg: 84  Min: 76  Max: 92  RR 17 (05/21/24 0317) Resp  Avg: 17.5  Min: 17  Max: 18   FHT: 130-135 / moderate variability / accelerations present / absent decelerations  Toco: q 3-4 mins  Fetal wellbeing: FHT category 1  Jon RONAL Pon, MD Obstetrics & Gynecology, PGY-1 [facility]

## 2024-05-21 NOTE — Progress Notes (Signed)
 Strip Review   9:17 AM Mary Underwood, Mary Underwood is a 28 y.o. H2E6885 @ [redacted]w[redacted]d admitted 05/20/2024 for preterm labor.  T 37 C (98.6 F) (05/21/24 0742) Temp  Avg: 36.8 C (98.3 F)  Min: 36.6 C (97.8 F)  Max: 37 C (98.6 F)  BP 101/62 (05/21/24 0742) BP  Min: 101/62  Max: 110/60  HR 62 (05/21/24 0742) Pulse  Avg: 75.3  Min: 62  Max: 92  RR 16 (05/21/24 0622) Resp  Avg: 17  Min: 16  Max: 18   FHT: 125 / moderate variability / accelerations present / absent decelerations  Toco: q4-8  Fetal wellbeing: FHT category 1  Attestation Statement:   I personally performed the service, non-incident to. (WP)   SIANNA MICHELLE CONGDON, CNM

## 2024-05-21 NOTE — Progress Notes (Signed)
 I received report from KYM Gaskins, RN and WENDI Kells, RN. I concur with previous assessment. Pt denies any needs at this time. Will notify RN should she start to experience any PTL symptoms.   Will continue to monitor.

## 2024-05-21 NOTE — Anesthesia Preprocedure Evaluation (Addendum)
 PASS-Perioperative Anesthesia & Surgical Screening Patient Alerts: Procedure:  Date: 05/21/24 Procedure: OB PAT    CC/HPI  Mary Underwood is a 28 y.o. 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   Relevant Comorbidities: - Obesity: BMI 27  - Hypertensive disorders: no - Asthma: no - Coagulopathy: no - Spine surgery/trauma: no  Prior Neuraxial: - No prior neuraxial with previous pregnancies   Previous Anesthetics Issues: - None identified by patient. No hx of familial complications identified by patient   Prior Obstetric Events: OB History  Gravida Para Term Preterm AB Living  7 4 3 1 1 4   SAB IAB Ectopic Molar Multiple Live Births  0 1 0 0  4    # Outcome Date GA Lbr Len/2nd Weight Sex Type Anes PTL Lv  7 Current           6 Preterm 11/17/18 [redacted]w[redacted]d 03:30 / 00:05 2.17 kg M Vag-Spont None  LIV  5 Term 11/03/17 [redacted]w[redacted]d  3.25 kg F Vag-Spont None  LIV  4 Term 02/26/16 [redacted]w[redacted]d  2.722 kg F Vag-Spont   LIV  3 IAB 03/10/15 110w0d         2 Term 10/14/13 [redacted]w[redacted]d  2.722 kg F Vag-Spont None N LIV  1 Gravida              Relevant Problems  No relevant active problems   Vitals (36hrs): Temp:  [36.8 C (98.2 F)] 36.8 C (98.2 F) Heart Rate:  [92] 92 Resp:  [18] 18 BP: (105)/(64) 105/64 SpO2:  [99 %] 99 % Height:  [170.2 cm (5' 7.01)] 170.2 cm (5' 7.01) Relevant Risk Scores: STOP BANG: 0 [facility] Activity Status Index (DASI): 0  Medical History  Past Medical History Past Surgical History   Past  Surgical History:  Procedure Laterality Date  . EYE SURGERY      Social/Family History   Social History   Occupational History  . Not on file  Tobacco Use  . Smoking status: Never  . Smokeless tobacco: Never  Vaping Use  . Vaping status: Never Used  Substance and Sexual Activity  . Alcohol use: Never  . Drug use: Yes    Comment: THC  . Sexual activity: Yes    Partners: Male    Birth control/protection: None   Vaping/E-Cigarettes  . Vaping/E-Cigarette Use Never User   . Start Date    . Cartridges/Day    . Quit Date     Family History  Problem Relation Age of Onset  . Heart disease Maternal Grandmother   . Diabetes Maternal Grandmother   . High blood pressure (Hypertension) Maternal Grandmother   . High blood pressure (Hypertension) Maternal Grandfather   . Diabetes Paternal Grandmother   . High blood pressure (Hypertension) Paternal Grandmother   . High blood pressure (Hypertension) Paternal Grandfather    Allergies  No Known Allergies Medications   Current Facility-Administered Medications  Medication Dose Route Frequency Last Rate Last Admin  . ondansetron  (PF) (ZOFRAN ) injection 4 mg  4 mg Intravenous  Q8H PRN      . fentaNYL  (PF) (SUBLIMAZE ) injection 50 mcg  50 mcg Intravenous Q30 Min PRN      . terBUTaline (BRETHINE) injection 0.25 mg  0.25 mg Subcutaneous As Directed Admin      . mineral oil oral liquid 30 mL  30 mL Topical Q1H PRN      . oxytocin  in 0.9 % sod chloride (PITOCIN ) 20 unit/500 mL postpartum infusion  10-20 Units/hr Intravenous As Directed      . methylergonovine (METHERGINE) injection 0.2 mg  0.2 mg Intramuscular As Directed      . carboprost (HEMABATE) injection 250 mcg  250 mcg Intramuscular As Directed      . miSOPROStoL  (CYTOTEC ) tablet 600 mcg  600 mcg Buccal As Directed      . diphenoxylate-atropine (LOMOTIL) 2.5-0.025 mg tablet 2 tablet  2 tablet Oral As Directed      . lidocaine  (XYLOCAINE ) 1 % injection 20 mL  20 mL [facility] As  Directed      . lidocaine  (XYLOCAINE ) 1 % injection 0.5 mL  0.5 mL Subcutaneous As Directed       Physical Exam  Airway/HEENT/Dental: Mallampati score: II. Thyromental distance is >3 FB finger breadths. Mouth opening: Normal. Neck ROM is Full.   Pulmonary: Respiratory effort: Normal.   Skin: Skin intact, warm, and dry.   Musculoskeletal: Moves all extremities. Normal strength.  Neuro: Oriented to person, place and time. Neuro grossly intact.   Constitutional: Well-developed, well-nourished, no distress.   Test Results    No results found for: WBC, HGB, HCT, PLT, CHOL, TRIG, HDL, LDLDIRECT, ALT, AST, GLUCOSE, NA, K, CL, CALCIUM, MG, CREATININE, BUN, CO2, ALB, TSH, PSA, HGBA1C, INR, PT, APTT, VITD        Patient Instructions   Problem List   Patient Active Problem List  Diagnosis  . Rh negative state in antepartum period (HHS-HCC)  . Rubella non-immune status, antepartum (HHS-HCC)  . Hemoglobin C trait ()  . Supervision of high-risk pregnancy (HHS-HCC)  . Grand multipara  . Previous preterm delivery, antepartum (HHS-HCC)  . Marginal insertion of umbilical cord affecting management of mother in third trimester (HHS-HCC)   Assessment & Plan  ASA: 2 Anesthesia options discussed: Epidural, General and Spinal    Day of Surgery  Day of Surgery Exam

## 2024-05-21 NOTE — Progress Notes (Addendum)
 Labor Progress Note   6:51 AM Mary Underwood, Mary Underwood is a 28 y.o. H2E6885 @ [redacted]w[redacted]d admitted 05/20/2024 for preterm labor.  Pregnancy c/b:   Rh negative state in antepartum period (HHS-HCC)   Rubella non-immune status, antepartum (HHS-HCC)   Hemoglobin C trait ()   Supervision of high-risk pregnancy (HHS-HCC)   Grand multipara   Previous preterm delivery, antepartum (HHS-HCC)   Marginal insertion of umbilical cord affecting management of mother in third trimester (HHS-HCC)  Subjective:  Pt reports she feels contractions are more frequent than before but no change in intensity. POC discussed. Pt consents to SVE.  Objective:   T 36.9 C (98.4 F) (05/21/24 0622) Temp  Avg: 36.7 C (98.1 F)  Min: 36.6 C (97.8 F)  Max: 36.9 C (98.4 F)  BP 110/60 (05/21/24 0622) BP  Min: 105/64  Max: 110/60  HR 71 (05/21/24 0622) Pulse  Avg: 79.7  Min: 71  Max: 92  RR 16 (05/21/24 0622) Resp  Avg: 17  Min: 16  Max: 18   FHT: 125 / moderate variability / accelerations present / absent decelerations  Toco: q3-7 SVE:  5 / 50 / -3 ballotable  O Negative  and  Recent Labs    05/21/24 0238  WBC 8.6  HGB 10.8*  HCT 29.3*  PLT 223    Assessment/Plan:  28 y.o. H2E6885 @ [redacted]w[redacted]d admitted for preterm labor  Hx pre-term delivery - G6 in 2020 at [redacted]w[redacted]d in ER  Grand multipara - For context, higher PPH risk - Consult pelvic PT PP  Rh neg - postpartum Rhogam if indicated  Rub NI - recommend vax PP  Marginal cord insertion  - be aware for delivery   Labor management:  - Expectant management - Pt desires to be unmedicated - Labor interventions: Ripening: favorable cervix, not indicated Augmentation: n/a   Membranes: Intact membranes -  GBS: Negative for group B beta hemolytic streptococci /--/-- (10/31 2218)  no antibiotic prophylaxis indicated -  Pain management: Non-pharmacologic (position change, massage, counter pressure, ice/heat packs)  Fetal wellbeing: FHT category 1 Level of  Interpreter Services: No interpreter needed (no language barrier)   Attestation Statement:   I personally performed the service, non-incident to. (WP)   SIANNA MICHELLE CONGDON, CNM

## 2024-05-21 NOTE — Unmapped External Note (Signed)
 Dispatched to a pregnancy BLS.  Arrived on scene with the Pt standing at her hotel door with her family. M5  walked up to the Pt on the second floor. Pt seemed to be in no obvious  distress, was A/Ox4 and had a clear and open airway. Pt advised she was [redacted]  weeks pregnant, had been due on December 5th and her water had broken around  2050 prior to M5's arrival. Pt stated there was no blood, she has not been  experiencing any complications during this pregnancy, and she had not had any  contractions. Pt advised this was her 5th pregnancy, that 3 pregnancies had  been full-term and her 4th pregnancy had delivered at 32 weeks, so she was  considered high risk for this pregnancy but that she has not had any abortions.  Pt also advised that on October 18th she had been to her OBGYN and they had  stated she was approximately 2 centimeters dilated at that time. Pt was  assisted to the ambulance and laid left lateral recumbent. Pt advised she had  no medical complaints other than the pain in her lower back.   While enroute to [facility] vitals were obtained as documented. Pt did  experience one contraction on the way to [facility] at 2314 that lasted 20  seconds and ended at 2315. All interventions done as documented in flowchart.   When at L&D report was given, signatures received and Pt care was transferred  to [facility].   Duwaine Jumbo

## 2024-05-22 DIAGNOSIS — O26893 Other specified pregnancy related conditions, third trimester: Secondary | ICD-10-CM | POA: Diagnosis not present

## 2024-05-22 DIAGNOSIS — O09213 Supervision of pregnancy with history of pre-term labor, third trimester: Secondary | ICD-10-CM | POA: Diagnosis not present

## 2024-05-22 DIAGNOSIS — O0943 Supervision of pregnancy with grand multiparity, third trimester: Secondary | ICD-10-CM | POA: Diagnosis not present

## 2024-05-22 DIAGNOSIS — Z3A37 37 weeks gestation of pregnancy: Secondary | ICD-10-CM | POA: Diagnosis not present

## 2024-05-22 DIAGNOSIS — Z6741 Type O blood, Rh negative: Secondary | ICD-10-CM | POA: Diagnosis not present

## 2024-05-22 DIAGNOSIS — Z2839 Other underimmunization status: Secondary | ICD-10-CM | POA: Diagnosis not present

## 2024-05-22 NOTE — Progress Notes (Signed)
 Antepartum Daily Progress Note  Mary Underwood is a 28 y.o. H2E6885 at [redacted]w[redacted]d (by LMP).  Hospital Day: 3 admitted for tPTL.  Pregnancy complications:   Rh negative state in antepartum period (HHS-HCC)   Rubella non-immune status, antepartum (HHS-HCC)   Hemoglobin C trait ()   Supervision of high-risk pregnancy (HHS-HCC)   Grand multipara   Previous preterm delivery, antepartum (HHS-HCC)   Marginal insertion of umbilical cord affecting management of mother in third trimester (HHS-HCC). Hx of PD at 32 weeks Subjective:  24 hour events: Presented for ROM rule out. Was found not to be ruptured however was found to be 4 cm and progressed to 5 on two hour recheck. She was stable on the active service and so was admitted to AP for repeat cervical exam in the AM.   She feels well.  States that she had some ctx OVN around 3 AM that she rated a 7/10 in intensity but has not felt any since then. Was able to get some sleep. Denies vaginal bleeding, loss of fluid. She reports fetal movement.  Objective:    Current Vital Signs 24h Vital Sign Ranges  T 36.8 C (98.3 F) (05/21/24 1939) Temp  Avg: 36.8 C (98.2 F)  Min: 36.4 C (97.5 F)  Max: 37 C (98.6 F)  BP 102/63 (05/22/24 0515) BP  Min: 94/57  Max: 106/69  HR 68 (05/22/24 0515) Pulse  Avg: 77.6  Min: 62  Max: 99  RR 18 (05/22/24 0515) Resp  Avg: 17.8  Min: 17  Max: 18  O2sat 100 %   SpO2  Avg: 98.5 %  Min: 97 %  Max: 100 %   Last Weight: 78.5 kg (173 lb 1 oz) (05/21/24 0317) Admit Weight: 78.5 kg (173 lb 1 oz) (05/21/24 0317)  BMI: Body mass index is 27.16 kg/m. Height: 170 cm (5' 6.93)   General: Well nourished, well developed female in no acute distress. Cardiovascular: Regular rate and rhythm Respiratory: Normal respiratory effort  Abdomen: Soft, gravid, non tender Extremities: BLLE without erythema, edema, or tenderness   Last SVE: 5/50/-3 with bulging bag  Fetal presentation: cephalic (11/14)  SCHEDULED MEDICATIONS   terBUTaline, 0.25 mg, Subcutaneous, As Directed Admin    MEDICATION INFUSIONS     PRN MEDICATIONS  carboprost, 250 mcg, Intramuscular, As Directed diphenoxylate-atropine, 2 tablet, Oral, As Directed fentaNYL  (PF), 50 mcg, Intravenous, Q30 Min PRN lidocaine , 0.5 mL, Subcutaneous, As Directed lidocaine , 20 mL, Regional, As Directed methylergonovine, 0.2 mg, Intramuscular, As Directed mineral oil, 30 mL, Topical, Q1H PRN miSOPROStoL , 600 mcg, Buccal, As Directed ondansetron , 4 mg, Intravenous, Q8H PRN oxytocin  in 0.9 % sod chloride, 10-20 Units/hr, Intravenous, As Directed      O Negative Recent Labs  Lab 05/21/24 0238  WBC 8.6  HGB 10.8*  HCT 29.3*  PLT 223    Assessment & Plan:  Mary Underwood is a 28 y.o. H2E6885 at [redacted]w[redacted]d. Hospital Day: 3 admitted for labor rule out.  EOL - Contracting q 7-9 mins initially  - Hx of pre-term delivery [redacted]w[redacted]d in 2020 - SSE without evidence of rupture             Negative pooling             Negative Nitrazine             Negative ferning - SVE 4/70/-2 >> 5/50/-3 with bulging bag - Based on history and exam, low suspicion for rupture of membranes.  - BSUS confirmed cephalic, adequate fluid -  Will need recheck this AM   Grand multipara - For context, higher PPH risk - Consult pelvic PT PP   Hx pre-term delivery - G6 in 2020 at [redacted]w[redacted]d in ER   Rh negative - May need Rhogam PP pending baby's results   Rubella NI - Offer MMR vaccine PP   Marginal insertion of umbilical cord - For awareness - gUS prior to dc   Fetal Well-being: Monitoring with daily NST, antenatal steroids not given   Delivery Planning/ Disposition: pending repeat cervical exam  - Consent for Cesarean not signed   [facility] DERI CLAY, MD  Antepartum Pager *[phone]   Maternal-Fetal Medicine Fellow I personally saw and evaluated the patient, and participated in the management and treatment plan as documented in the resident's note. I agree with the assessment  and plan with the following additions/changes:   28 y.o. H2E6885 at [redacted]w[redacted]d admitted on 05/08/2024 for PTL at [redacted]w[redacted]d now with stable cervical examinations for over 24 hrs at 4-5 cm. Patient asymptomatic this AM, no continued contractions, no bleeding/LOF. Re-examination this afternoon unchanged at 4 cm, stable for dc, lives 10 min away. Anemia on CBC with Hct 29, ferritin 8 IV iron prior to dc in setting of IDA. Strict labor return precautions given. Plan for growth US  in setting of marginal cord insertion prior to dc.   Armando Shake, MD PGY5 Maternal Fetal Medicine [facility]       ------------------------------------------------------------------------------- Attestation signed by Roger Arlean Almarie Devora, MD at 05/24/2024  9:26 PM Maternal-Fetal Medicine Attending: Pt seen and evaluated by me during am rounds with the AP service. I have reviewed the history, physical, assessment and plan, and progress notes for hospital course.  Attestation Statement:   I personally saw and evaluated the patient, and participated in the management and treatment plan as documented in the resident/fellow note.  Arlean Devora Roger, MD, M.Phil Division of Maternal-Fetal Medicine [facility]  -------------------------------------------------------------------------------

## 2024-05-22 NOTE — Initial Assessments (Signed)
 Case Management Assessment and Discharge Plan   Reviewed patient screening documentation: Screen Complete: No High Risk Needs Identified  Patient Information: Mary Underwood  [address] [place] [address]  [birth year]  28 y.o.  [id number]  [id number]    Admission Date and Reason: Chart reviewed. Admitted 05/20/2024 11:27 PM for tPTL at [redacted]w[redacted]d.  Patient Active Problem List  Diagnosis  . Rh negative state in antepartum period (HHS-HCC)  . Rubella non-immune status, antepartum (HHS-HCC)  . Hemoglobin C trait ()  . Supervision of high-risk pregnancy (HHS-HCC)  . Grand multipara  . Previous preterm delivery, antepartum (HHS-HCC)  . Marginal insertion of umbilical cord affecting management of mother in third trimester (HHS-HCC)    Per chart review-pt is now [redacted]w[redacted]d. Pt found to be 4cm--progressed to 5 cm at last check. Daily NST. No CM needs identified at this time. Case Management will remain available if needs arise.  HAILEE ROBSON

## 2024-05-22 NOTE — Procedures (Signed)
 Procedure Note NST An NST was performed on Tawni Edison at [redacted]w[redacted]d on 05/22/2024.  FHR baseline 125, 15 x 15 accelerations, no decels, moderate variability.  No UCs noted.  NST reactive.    NST Start Time: 0955  NST End Time: 1026  NST Total Time: 31 Minute(s)   Attestation Statement:   I personally performed the service, non-incident to. (WP)   ESTELA D FIELD, CNM

## 2024-05-25 ENCOUNTER — Encounter: Payer: Self-pay | Admitting: Obstetrics & Gynecology

## 2024-05-25 ENCOUNTER — Ambulatory Visit (INDEPENDENT_AMBULATORY_CARE_PROVIDER_SITE_OTHER): Admitting: Obstetrics & Gynecology

## 2024-05-25 VITALS — BP 107/72 | HR 99 | Wt 176.4 lb

## 2024-05-25 DIAGNOSIS — O43193 Other malformation of placenta, third trimester: Secondary | ICD-10-CM

## 2024-05-25 DIAGNOSIS — Z3A37 37 weeks gestation of pregnancy: Secondary | ICD-10-CM

## 2024-05-25 DIAGNOSIS — O0993 Supervision of high risk pregnancy, unspecified, third trimester: Secondary | ICD-10-CM | POA: Diagnosis not present

## 2024-05-25 NOTE — Patient Instructions (Signed)

## 2024-05-25 NOTE — Progress Notes (Signed)
   PRENATAL VISIT NOTE  Subjective:  Mary Underwood is a 28 y.o. [phone] at [redacted]w[redacted]d being seen today for ongoing prenatal care.  She is currently monitored for the following issues for this high-risk pregnancy and has Rh negative state in antepartum period; Supervision of high-risk pregnancy; Grand multipara; Previous preterm delivery at 32 weeks, antepartum; and Marginal insertion of umbilical cord affecting management of mother in third trimester on their problem list.  Patient reports no complaints.  Was recently admitted at [facility] for observation  for threatened preterm labor, was discharged after no PTL/PROM was noted.  Contractions: Irregular. Vag. Bleeding: None.  Movement: Present. Denies leaking of fluid.   The following portions of the patient's history were reviewed and updated as appropriate: allergies, current medications, past family history, past medical history, past social history, past surgical history and problem list.   Objective:   Vitals:   05/25/24 1318  BP: 107/72  Pulse: 99  Weight: 176 lb 6 oz (80 kg)    Fetal Status:  Fetal Heart Rate (bpm): 146 Fundal Height: 37 cm Movement: Present Presentation: Vertex  General: Alert, oriented and cooperative. Patient is in no acute distress.  Skin: Skin is warm and dry. No rash noted.   Cardiovascular: Normal heart rate noted  Respiratory: Normal respiratory effort, no problems with respiration noted  Abdomen: Soft, gravid, appropriate for gestational age.  Pain/Pressure: Present     Pelvic: Cervical exam performed in the presence of a chaperone Dilation: 5 Effacement (%): 70 Station: -2. Estate Manager/land Agent, CMA  Extremities: Normal range of motion.     Mental Status: Normal mood and affect. Normal behavior. Normal judgment and thought content.      12/18/2023    2:18 PM  Depression screen PHQ 2/9  Decreased Interest 1  Down, Depressed, Hopeless 0  PHQ - 2 Score 1  Altered sleeping 0  Tired, decreased energy 1   Change in appetite 1  Feeling bad or failure about yourself  0  Trouble concentrating 0  Moving slowly or fidgety/restless 0  Suicidal thoughts 0  PHQ-9 Score 3      Data saved with a previous flowsheet row definition        12/18/2023    2:19 PM  GAD 7 : Generalized Anxiety Score  Nervous, Anxious, on Edge 0  Control/stop worrying 0  Worry too much - different things 0  Trouble relaxing 0  Restless 0  Easily annoyed or irritable 1  Afraid - awful might happen 0  Total GAD 7 Score 1    Assessment and Plan:  Pregnancy: H2E6875 at [redacted]w[redacted]d 1. Marginal insertion of umbilical cord affecting management of mother in third trimester MFM scan on 05/28/24, will follow up results and manage accordingly.  2. [redacted] weeks gestation of pregnancy 3. Supervision of high risk pregnancy in third trimester (Primary) No issues .  Labor symptoms and general obstetric precautions including but not limited to vaginal bleeding, contractions, leaking of fluid and fetal movement were reviewed in detail with the patient. Please refer to After Visit Summary for other counseling recommendations.   Return in about 1 week (around 06/01/2024) for OFFICE OB VISIT (MD only).  Future Appointments  Date Time Provider Department Center  05/28/2024  1:15 PM [facility] PROVIDER 1 [facility]  05/28/2024  1:30 PM [facility] US[facility]  06/01/2024  2:30 PM Izell Harari, MD [facility]    Gloris Hugger, MD

## 2024-05-28 ENCOUNTER — Ambulatory Visit: Attending: Obstetrics and Gynecology | Admitting: Obstetrics and Gynecology

## 2024-05-28 ENCOUNTER — Ambulatory Visit

## 2024-05-28 DIAGNOSIS — O09213 Supervision of pregnancy with history of pre-term labor, third trimester: Secondary | ICD-10-CM | POA: Insufficient documentation

## 2024-05-28 DIAGNOSIS — O43193 Other malformation of placenta, third trimester: Secondary | ICD-10-CM

## 2024-05-28 DIAGNOSIS — Z3A37 37 weeks gestation of pregnancy: Secondary | ICD-10-CM | POA: Diagnosis not present

## 2024-05-28 DIAGNOSIS — E669 Obesity, unspecified: Secondary | ICD-10-CM

## 2024-05-28 DIAGNOSIS — Z362 Encounter for other antenatal screening follow-up: Secondary | ICD-10-CM | POA: Diagnosis not present

## 2024-05-28 NOTE — Progress Notes (Signed)
 Maternal-Fetal Medicine Consultation  Name: Mary Underwood  MRN: [id number]  GA: H2E6875 [redacted]w[redacted]d   Marginal cord insertion.  Patient is here for fetal growth assessment.  Ultrasound Fetal growth is appropriate for gestational age.  Normal amniotic fluid.  Cephalic presentation.  Patient had vaginal exam performed at your office 3 days ago and the cervix was 5 cm dilated.  She does not have uterine contractions.  Patient does not give history of precipitate labor.  Patient reports she lives 10 minutes from the hospital. I advised the patient to go to the hospital if she has leakage of amniotic fluid or uterine contractions.  Recommendations -Recommend delivery at [redacted] weeks gestation.     Consultation including face-to-face (more than 50%) counseling 10 minutes.

## 2024-06-01 ENCOUNTER — Encounter: Payer: Self-pay | Admitting: Obstetrics and Gynecology

## 2024-06-01 ENCOUNTER — Ambulatory Visit: Admitting: Obstetrics and Gynecology

## 2024-06-01 VITALS — BP 115/72 | HR 89 | Wt 178.0 lb

## 2024-06-01 DIAGNOSIS — Z641 Problems related to multiparity: Secondary | ICD-10-CM | POA: Diagnosis not present

## 2024-06-01 DIAGNOSIS — Z3A38 38 weeks gestation of pregnancy: Secondary | ICD-10-CM | POA: Diagnosis not present

## 2024-06-01 DIAGNOSIS — O43193 Other malformation of placenta, third trimester: Secondary | ICD-10-CM | POA: Diagnosis not present

## 2024-06-01 DIAGNOSIS — Z6791 Unspecified blood type, Rh negative: Secondary | ICD-10-CM | POA: Diagnosis not present

## 2024-06-01 DIAGNOSIS — O26899 Other specified pregnancy related conditions, unspecified trimester: Secondary | ICD-10-CM | POA: Diagnosis not present

## 2024-06-01 DIAGNOSIS — O0993 Supervision of high risk pregnancy, unspecified, third trimester: Secondary | ICD-10-CM

## 2024-06-01 NOTE — Progress Notes (Signed)
 PRENATAL VISIT NOTE  Subjective:  Mary Underwood is a 28 y.o. H2E6875 at [redacted]w[redacted]d being seen today for ongoing prenatal care.  She is currently monitored for the following issues for this low-risk pregnancy and has Rh negative state in antepartum period; Supervision of high-risk pregnancy; Grand multipara; and Marginal insertion of umbilical cord affecting management of mother in third trimester on their problem list.  Patient reports no complaints, just stable pelvic pressure  Contractions: Irritability. Vag. Bleeding: None.  Movement: Present. Denies leaking of fluid.   The following portions of the patient's history were reviewed and updated as appropriate: allergies, current medications, past family history, past medical history, past social history, past surgical history and problem list.   Objective:   Vitals:   06/01/24 1410  BP: 115/72  Pulse: 89  Weight: 178 lb (80.7 kg)    Fetal Status:  Fetal Heart Rate (bpm): 137 Fundal Height: 38 cm Movement: Present Presentation: Vertex  General: Alert, oriented and cooperative. Patient is in no acute distress.  Skin: Skin is warm and dry. No rash noted.   Cardiovascular: Normal heart rate noted  Respiratory: Normal respiratory effort, no problems with respiration noted  Abdomen: Soft, gravid, appropriate for gestational age.  Pain/Pressure: Present     Pelvic: Cervical exam performed in the presence of a chaperone Dilation: 4.5 Effacement (%): 50 Station: Ballotable  Extremities: Normal range of motion.  Edema: None  Mental Status: Normal mood and affect. Normal behavior. Normal judgment and thought content.      12/18/2023    2:18 PM  Depression screen PHQ 2/9  Decreased Interest 1  Down, Depressed, Hopeless 0  PHQ - 2 Score 1  Altered sleeping 0  Tired, decreased energy 1  Change in appetite 1  Feeling bad or failure about yourself  0  Trouble concentrating 0  Moving slowly or fidgety/restless 0  Suicidal thoughts 0  PHQ-9  Score 3      Data saved with a previous flowsheet row definition        12/18/2023    2:19 PM  GAD 7 : Generalized Anxiety Score  Nervous, Anxious, on Edge 0  Control/stop worrying 0  Worry too much - different things 0  Trouble relaxing 0  Restless 0  Easily annoyed or irritable 1  Afraid - awful might happen 0  Total GAD 7 Score 1    Assessment and Plan:  Pregnancy: H2E6875 at [redacted]w[redacted]d 1. Supervision of high risk pregnancy in third trimester (Primary) Stable cervical exam. I offered her a 39wk IOL on Friday. Pt lives in [place] now and wondering about getting a 39wk IOL at [facility]; she was inpatient overnight there for being 5cm late last month. She states she doesn't have an appt there. I told her to call them after this appt and ask about a transfer of care. I also said that she can go to [facility] and please not try and make it to [facility] if she thinks she's in labor  2. [redacted] weeks gestation of pregnancy GBS neg at [facility] on 10/31 and 10/18  3. Marginal insertion of umbilical cord affecting management of mother in third trimester 11/20: efw 34%, 3253g, ac 23%, afi 16, cephalic  4. Rh negative state in antepartum period  5. Grand multipara  Term labor symptoms and general obstetric precautions including but not limited to vaginal bleeding, contractions, leaking of fluid and fetal movement were reviewed in detail with the patient. Please refer to After Visit Summary for other counseling  recommendations.   Return in about 1 week (around 06/08/2024) for low risk ob, in person, md or app.  Future Appointments  Date Time Provider Department Center  06/01/2024  2:30 PM Izell Harari, MD [facility]    Harari Izell, MD

## 2024-06-01 NOTE — Progress Notes (Signed)
 ROB  Cervix check today.   Notes was 5 cm last cervix check.

## 2024-06-02 ENCOUNTER — Encounter: Payer: Self-pay | Admitting: Obstetrics and Gynecology

## 2024-06-03 ENCOUNTER — Other Ambulatory Visit: Payer: Self-pay | Admitting: Obstetrics and Gynecology

## 2024-06-06 DIAGNOSIS — F17200 Nicotine dependence, unspecified, uncomplicated: Secondary | ICD-10-CM | POA: Diagnosis not present

## 2024-06-06 DIAGNOSIS — O368130 Decreased fetal movements, third trimester, not applicable or unspecified: Secondary | ICD-10-CM | POA: Diagnosis not present

## 2024-06-06 DIAGNOSIS — Z3A39 39 weeks gestation of pregnancy: Secondary | ICD-10-CM | POA: Diagnosis not present

## 2024-06-06 DIAGNOSIS — O26893 Other specified pregnancy related conditions, third trimester: Secondary | ICD-10-CM | POA: Diagnosis not present

## 2024-06-06 DIAGNOSIS — Z6741 Type O blood, Rh negative: Secondary | ICD-10-CM | POA: Diagnosis not present

## 2024-06-06 DIAGNOSIS — Z888 Allergy status to other drugs, medicaments and biological substances status: Secondary | ICD-10-CM | POA: Diagnosis not present

## 2024-06-07 DIAGNOSIS — O43193 Other malformation of placenta, third trimester: Secondary | ICD-10-CM | POA: Diagnosis not present

## 2024-06-07 DIAGNOSIS — Z3A39 39 weeks gestation of pregnancy: Secondary | ICD-10-CM | POA: Diagnosis not present

## 2024-06-08 ENCOUNTER — Encounter: Admitting: Obstetrics & Gynecology

## 2024-06-08 NOTE — Progress Notes (Signed)
 Per Trudy, [place]  Called Pt 11/30 approx 0530 to come in for Elective induction- No answer and No VM set up. Called Pt ALT # mom did not answer LVM to return call.   11/30 approx 2030 called pt for Elective IND- No answer  Called ALT # mom DNA. LVM to return call.  Called Pt approx 0515- No answer/ No option to LVM

## 2024-06-19 ENCOUNTER — Inpatient Hospital Stay (HOSPITAL_COMMUNITY): Admission: RE | Admit: 2024-06-19 | Source: Home / Self Care | Admitting: Family Medicine

## 2024-06-19 ENCOUNTER — Inpatient Hospital Stay (HOSPITAL_COMMUNITY)

## 2024-07-20 ENCOUNTER — Ambulatory Visit: Admitting: Obstetrics & Gynecology

## 2024-07-29 ENCOUNTER — Ambulatory Visit: Admitting: Certified Nurse Midwife

## 2024-07-29 DIAGNOSIS — Z3009 Encounter for other general counseling and advice on contraception: Secondary | ICD-10-CM

## 2024-08-06 ENCOUNTER — Ambulatory Visit: Admitting: Obstetrics and Gynecology
